# Patient Record
Sex: Male | Born: 1995 | Race: Black or African American | Hispanic: Yes | Marital: Single | Smoking: Current some day smoker
Health system: Southern US, Community
[De-identification: ages and names within clinical notes are randomized; demographics above are authoritative.]

---

## 2018-07-29 ENCOUNTER — Encounter: Payer: Self-pay | Admitting: Internal Medicine

## 2018-07-29 ENCOUNTER — Ambulatory Visit: Payer: Self-pay | Admitting: Internal Medicine

## 2018-07-29 VITALS — BP 122/80 | HR 78 | Temp 100.8°F | Resp 12 | Ht 70.25 in | Wt 177.0 lb

## 2018-07-29 DIAGNOSIS — B349 Viral infection, unspecified: Secondary | ICD-10-CM

## 2018-07-29 DIAGNOSIS — J029 Acute pharyngitis, unspecified: Secondary | ICD-10-CM

## 2018-07-29 LAB — POCT RAPID STREP A (OFFICE): Rapid Strep A Screen: NEGATIVE

## 2018-07-29 NOTE — Progress Notes (Signed)
Subjective:    Patient ID: Gregg Chandler, male    DOB: 03/23/1996, 23 y.o.   MRN: [id number]  HPI   Here to establish.  Became ill 2 days ago with fever and headache in temporal parietal area.  Developed body aches.  + posterior pharyngeal drainage.  If tries to cough up phlegm, gags him and vomits.  If brushes teeth, gags and vomits. Vomited up green liquid--very small amount. Able to eat soup down last night. Today, tried to eat some meat and rice, but the smell got to him and he vomited again.  States he later ate the food and has kept it down. He is trying to drink water and juice.  Had hot tea last night with lime and honey. No ear or throat pain Does not work outside.  Has not been outside much recently. Was in [place] for vacation and visiting family for a month, returning to [place] on the 12th of January.    Has taken Nyquil and a medication called algoh, which contains an antipyretic, pain reliever, antihistamine and decongestant.  States the latter really helps.   Current Meds  Medication Sig  . Pseudoeph-Doxylamine-DM-APAP (NYQUIL PO) Take by mouth as needed.   No Known Allergies   No past medical history on file.  History reviewed. No pertinent surgical history.   Family History  Problem Relation Age of Onset  . Diabetes Mother    Social History   Socioeconomic History  . Marital status: Single    Spouse name: Not on file  . Number of children: Not on file  . Years of education: Not on file  . Highest education level: High school graduate  Occupational History  . Occupation: Call center  Social Needs  . Financial resource strain: Not on file  . Food insecurity:    Worry: Sometimes true    Inability: Sometimes true  . Transportation needs:    Medical: No    Non-medical: No  Tobacco Use  . Smoking status: Current Some Day Smoker  . Smokeless tobacco: Never Used  . Tobacco comment: hookah 3 times weekly in DR.  Substance and Sexual  Activity  . Alcohol use: Never    Frequency: Never  . Drug use: Never  . Sexual activity: Not on file  Lifestyle  . Physical activity:    Days per week: Not on file    Minutes per session: Not on file  . Stress: Not on file  Relationships  . Social connections:    Talks on phone: Not on file    Gets together: Not on file    Attends religious service: Not on file    Active member of club or organization: Not on file    Attends meetings of clubs or organizations: Not on file    Relationship status: Not on file  . Intimate partner violence:    Fear of current or ex partner: Not on file    Emotionally abused: Not on file    Physically abused: Not on file    Forced sexual activity: Not on file  Other Topics Concern  . 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  NT over frontal and maxillary sinuses Neck:  Supple, No adenopathy Chest:  CTA CV:  RRR with normal S1 and S2, No S3, S4 or murmur.  Radial and DP pulses normal and equal Abd:  S, NT, No HSM or mass, + BS Skin: no rash.  Rapid strep -     Assessment & Plan:  Viral Syndrome:  Clear liquids small amounts frequently for next 8-12 hours.  If tolerates, to gradually advance diet.   Avoid greasy, heavy foods for next several days. No dairy Ok to continue cold remedies, but to check antipyretic in remedy and do not take extra for fever.

## 2018-07-29 NOTE — Patient Instructions (Signed)
Clear liquids Call if not able to keep water or other liquids down.

## 2019-03-03 ENCOUNTER — Encounter (HOSPITAL_COMMUNITY): Payer: Self-pay | Admitting: Emergency Medicine

## 2019-03-03 ENCOUNTER — Other Ambulatory Visit: Payer: Self-pay

## 2019-03-03 ENCOUNTER — Inpatient Hospital Stay (HOSPITAL_COMMUNITY)
Admission: EM | Admit: 2019-03-03 | Discharge: 2019-03-08 | DRG: 059 | Disposition: A | Discharge status: Home / Self Care | Payer: PRIVATE HEALTH INSURANCE | Attending: Internal Medicine | Admitting: Internal Medicine

## 2019-03-03 ENCOUNTER — Other Ambulatory Visit: Payer: Self-pay | Admitting: Nurse Practitioner

## 2019-03-03 ENCOUNTER — Emergency Department (HOSPITAL_COMMUNITY): Payer: PRIVATE HEALTH INSURANCE

## 2019-03-03 DIAGNOSIS — H468 Other optic neuritis: Secondary | ICD-10-CM | POA: Diagnosis present

## 2019-03-03 DIAGNOSIS — Z8249 Family history of ischemic heart disease and other diseases of the circulatory system: Secondary | ICD-10-CM

## 2019-03-03 DIAGNOSIS — H5461 Unqualified visual loss, right eye, normal vision left eye: Secondary | ICD-10-CM

## 2019-03-03 DIAGNOSIS — G35D Multiple sclerosis, unspecified: Secondary | ICD-10-CM | POA: Diagnosis present

## 2019-03-03 DIAGNOSIS — Z8349 Family history of other endocrine, nutritional and metabolic diseases: Secondary | ICD-10-CM

## 2019-03-03 DIAGNOSIS — R519 Headache, unspecified: Secondary | ICD-10-CM

## 2019-03-03 DIAGNOSIS — R030 Elevated blood-pressure reading, without diagnosis of hypertension: Secondary | ICD-10-CM | POA: Diagnosis present

## 2019-03-03 DIAGNOSIS — H547 Unspecified visual loss: Secondary | ICD-10-CM | POA: Diagnosis not present

## 2019-03-03 DIAGNOSIS — F17200 Nicotine dependence, unspecified, uncomplicated: Secondary | ICD-10-CM | POA: Diagnosis present

## 2019-03-03 DIAGNOSIS — Z833 Family history of diabetes mellitus: Secondary | ICD-10-CM

## 2019-03-03 DIAGNOSIS — Z20828 Contact with and (suspected) exposure to other viral communicable diseases: Secondary | ICD-10-CM | POA: Diagnosis present

## 2019-03-03 DIAGNOSIS — R7982 Elevated C-reactive protein (CRP): Secondary | ICD-10-CM | POA: Diagnosis present

## 2019-03-03 DIAGNOSIS — H6690 Otitis media, unspecified, unspecified ear: Secondary | ICD-10-CM | POA: Diagnosis present

## 2019-03-03 DIAGNOSIS — G35 Multiple sclerosis: Principal | ICD-10-CM | POA: Diagnosis present

## 2019-03-03 DIAGNOSIS — H469 Unspecified optic neuritis: Secondary | ICD-10-CM | POA: Diagnosis present

## 2019-03-03 DIAGNOSIS — D72829 Elevated white blood cell count, unspecified: Secondary | ICD-10-CM | POA: Diagnosis present

## 2019-03-03 LAB — CBC
HCT: 50.1 % (ref 39.0–52.0)
Hemoglobin: 16.3 g/dL (ref 13.0–17.0)
MCH: 28.3 pg (ref 26.0–34.0)
MCHC: 32.5 g/dL (ref 30.0–36.0)
MCV: 87.1 fL (ref 80.0–100.0)
Platelets: 309 10*3/uL (ref 150–400)
RBC: 5.75 MIL/uL (ref 4.22–5.81)
RDW: 12.3 % (ref 11.5–15.5)
WBC: 12 10*3/uL — ABNORMAL HIGH (ref 4.0–10.5)
nRBC: 0 % (ref 0.0–0.2)

## 2019-03-03 LAB — COMPREHENSIVE METABOLIC PANEL
ALT: 29 U/L (ref 0–44)
AST: 22 U/L (ref 15–41)
Albumin: 4.5 g/dL (ref 3.5–5.0)
Alkaline Phosphatase: 43 U/L (ref 38–126)
Anion gap: 13 (ref 5–15)
BUN: 17 mg/dL (ref 6–20)
CO2: 23 mmol/L (ref 22–32)
Calcium: 9.3 mg/dL (ref 8.9–10.3)
Chloride: 100 mmol/L (ref 98–111)
Creatinine, Ser: 0.91 mg/dL (ref 0.61–1.24)
GFR calc Af Amer: >60 mL/min (ref >60)
GFR calc non Af Amer: >60 mL/min (ref >60)
Glucose, Bld: 110 mg/dL — ABNORMAL HIGH (ref 70–99)
Potassium: 4.1 mmol/L (ref 3.5–5.1)
Sodium: 136 mmol/L (ref 135–145)
Total Bilirubin: 0.6 mg/dL (ref 0.3–1.2)
Total Protein: 7.4 g/dL (ref 6.5–8.1)

## 2019-03-03 LAB — DIFFERENTIAL
Abs Immature Granulocytes: 0.04 10*3/uL (ref 0.00–0.07)
Basophils Absolute: 0 10*3/uL (ref 0.0–0.1)
Basophils Relative: 0 %
Eosinophils Absolute: 0 10*3/uL (ref 0.0–0.5)
Eosinophils Relative: 0 %
Immature Granulocytes: 0 %
Lymphocytes Relative: 12 %
Lymphs Abs: 1.5 10*3/uL (ref 0.7–4.0)
Monocytes Absolute: 0.5 10*3/uL (ref 0.1–1.0)
Monocytes Relative: 4 %
Neutro Abs: 10 10*3/uL — ABNORMAL HIGH (ref 1.7–7.7)
Neutrophils Relative %: 84 %

## 2019-03-03 LAB — I-STAT CHEM 8, ED
BUN: 21 mg/dL — ABNORMAL HIGH (ref 6–20)
Calcium, Ion: 1.15 mmol/L (ref 1.15–1.40)
Chloride: 103 mmol/L (ref 98–111)
Creatinine, Ser: 0.8 mg/dL (ref 0.61–1.24)
Glucose, Bld: 109 mg/dL — ABNORMAL HIGH (ref 70–99)
HCT: 50 % (ref 39.0–52.0)
Hemoglobin: 17 g/dL (ref 13.0–17.0)
Potassium: 4 mmol/L (ref 3.5–5.1)
Sodium: 137 mmol/L (ref 135–145)
TCO2: 25 mmol/L (ref 22–32)

## 2019-03-03 LAB — PROTIME-INR
INR: 1.1 (ref 0.8–1.2)
Prothrombin Time: 13.9 seconds (ref 11.4–15.2)

## 2019-03-03 LAB — APTT: aPTT: 33 seconds (ref 24–36)

## 2019-03-03 MED ORDER — SODIUM CHLORIDE 0.9% FLUSH: 3.0000 mL | Freq: Once | INTRAVENOUS | Status: DC

## 2019-03-03 NOTE — ED Triage Notes (Signed)
PT states 7 days ago he lost his vision in the right eye. Denies Lamonte Sakai, denies any other neuro symptoms. Denies injury to eye.

## 2019-03-04 ENCOUNTER — Encounter (HOSPITAL_COMMUNITY): Payer: Self-pay | Admitting: Internal Medicine

## 2019-03-04 ENCOUNTER — Emergency Department (HOSPITAL_COMMUNITY): Payer: PRIVATE HEALTH INSURANCE

## 2019-03-04 DIAGNOSIS — H468 Other optic neuritis: Secondary | ICD-10-CM | POA: Diagnosis present

## 2019-03-04 DIAGNOSIS — Z833 Family history of diabetes mellitus: Secondary | ICD-10-CM | POA: Diagnosis not present

## 2019-03-04 DIAGNOSIS — H6690 Otitis media, unspecified, unspecified ear: Secondary | ICD-10-CM | POA: Diagnosis present

## 2019-03-04 DIAGNOSIS — R030 Elevated blood-pressure reading, without diagnosis of hypertension: Secondary | ICD-10-CM | POA: Diagnosis present

## 2019-03-04 DIAGNOSIS — H547 Unspecified visual loss: Secondary | ICD-10-CM | POA: Diagnosis present

## 2019-03-04 DIAGNOSIS — G35 Multiple sclerosis: Secondary | ICD-10-CM | POA: Diagnosis present

## 2019-03-04 DIAGNOSIS — H469 Unspecified optic neuritis: Secondary | ICD-10-CM | POA: Diagnosis not present

## 2019-03-04 DIAGNOSIS — R7982 Elevated C-reactive protein (CRP): Secondary | ICD-10-CM | POA: Diagnosis present

## 2019-03-04 DIAGNOSIS — Z8249 Family history of ischemic heart disease and other diseases of the circulatory system: Secondary | ICD-10-CM | POA: Diagnosis not present

## 2019-03-04 DIAGNOSIS — D72829 Elevated white blood cell count, unspecified: Secondary | ICD-10-CM

## 2019-03-04 DIAGNOSIS — Z8349 Family history of other endocrine, nutritional and metabolic diseases: Secondary | ICD-10-CM | POA: Diagnosis not present

## 2019-03-04 DIAGNOSIS — Z20828 Contact with and (suspected) exposure to other viral communicable diseases: Secondary | ICD-10-CM | POA: Diagnosis present

## 2019-03-04 DIAGNOSIS — F17200 Nicotine dependence, unspecified, uncomplicated: Secondary | ICD-10-CM | POA: Diagnosis present

## 2019-03-04 LAB — CBG MONITORING, ED: Glucose-Capillary: 130 mg/dL — ABNORMAL HIGH (ref 70–99)

## 2019-03-04 LAB — SEDIMENTATION RATE: Sed Rate: 1 mm/hr (ref 0–16)

## 2019-03-04 LAB — SARS CORONAVIRUS 2 (TAT 6-24 HRS): SARS Coronavirus 2: NEGATIVE

## 2019-03-04 LAB — C-REACTIVE PROTEIN: CRP: 1.2 mg/dL — ABNORMAL HIGH (ref <1.0)

## 2019-03-04 MED ORDER — ENOXAPARIN SODIUM 40 MG/0.4ML ~~LOC~~ SOLN
40.0000 mg | SUBCUTANEOUS | Status: DC
  Administered 2019-03-04 – 2019-03-07 (×4): 40 mg via SUBCUTANEOUS
  Filled 2019-03-04 (×5): qty 0.4

## 2019-03-04 MED ORDER — ONDANSETRON HCL 4 MG/2ML IJ SOLN: 4.0000 mg | Freq: Four times a day (QID) | INTRAMUSCULAR | Status: DC | PRN

## 2019-03-04 MED ORDER — GADOBUTROL 1 MMOL/ML IV SOLN
7.5000 mL | Freq: Once | INTRAVENOUS | Status: AC | PRN
  Administered 2019-03-04: 7.5 mL via INTRAVENOUS

## 2019-03-04 MED ORDER — DIPHENHYDRAMINE HCL 25 MG PO CAPS
25.0000 mg | ORAL_CAPSULE | Freq: Once | ORAL | Status: AC
  Administered 2019-03-04: 25 mg via ORAL
  Filled 2019-03-04: qty 1

## 2019-03-04 MED ORDER — ACETAMINOPHEN 650 MG RE SUPP: 650.0000 mg | Freq: Four times a day (QID) | RECTAL | Status: DC | PRN

## 2019-03-04 MED ORDER — ONDANSETRON HCL 4 MG PO TABS: 4.0000 mg | ORAL_TABLET | Freq: Four times a day (QID) | ORAL | Status: DC | PRN

## 2019-03-04 MED ORDER — ALBUTEROL SULFATE (2.5 MG/3ML) 0.083% IN NEBU: 2.5000 mg | INHALATION_SOLUTION | Freq: Four times a day (QID) | RESPIRATORY_TRACT | Status: DC | PRN

## 2019-03-04 MED ORDER — SODIUM CHLORIDE 0.9 % IV SOLN
1000.0000 mg | INTRAVENOUS | Status: AC
  Administered 2019-03-05 – 2019-03-08 (×4): 1000 mg via INTRAVENOUS
  Filled 2019-03-04 (×4): qty 8

## 2019-03-04 MED ORDER — AMOXICILLIN-POT CLAVULANATE 875-125 MG PO TABS
1.0000 | ORAL_TABLET | Freq: Two times a day (BID) | ORAL | Status: AC
  Administered 2019-03-04 – 2019-03-06 (×6): 1 via ORAL
  Filled 2019-03-04 (×7): qty 1

## 2019-03-04 MED ORDER — TRAZODONE HCL 50 MG PO TABS
50.0000 mg | ORAL_TABLET | Freq: Once | ORAL | Status: AC
  Administered 2019-03-04: 50 mg via ORAL
  Filled 2019-03-04 (×2): qty 1

## 2019-03-04 MED ORDER — PANTOPRAZOLE SODIUM 40 MG PO TBEC
40.0000 mg | DELAYED_RELEASE_TABLET | Freq: Every day | ORAL | Status: DC
  Administered 2019-03-04 – 2019-03-08 (×5): 40 mg via ORAL
  Filled 2019-03-04 (×5): qty 1

## 2019-03-04 MED ORDER — SODIUM CHLORIDE 0.9 % IV SOLN
1000.0000 mg | Freq: Once | INTRAVENOUS | Status: AC
  Administered 2019-03-04: 1000 mg via INTRAVENOUS
  Filled 2019-03-04: qty 8

## 2019-03-04 MED ORDER — LORAZEPAM 2 MG/ML IJ SOLN
1.0000 mg | Freq: Once | INTRAMUSCULAR | Status: AC
  Administered 2019-03-04: 1 mg via INTRAVENOUS
  Filled 2019-03-04: qty 1

## 2019-03-04 MED ORDER — ACETAMINOPHEN 325 MG PO TABS
650.0000 mg | ORAL_TABLET | Freq: Four times a day (QID) | ORAL | Status: DC | PRN
  Administered 2019-03-05 – 2019-03-06 (×2): 650 mg via ORAL
  Filled 2019-03-04 (×2): qty 2

## 2019-03-04 NOTE — ED Notes (Signed)
Pt wanting to leave, asking if we can "call results" discussed with pt he is waiting for EDP eval/treatment room. Alert/oriented, sitting with family. Pt agreeable to stay at this time.

## 2019-03-04 NOTE — Consult Note (Signed)
Neurology Consultation Reason for Consult: Right eye vision loss Referring Physician: Christy Gentles, D  CC: Right eye vision loss  History is obtained from: Patient  HPI: Gregg Chandler is a 23 y.o. male with no past medical history who has had right eye vision loss that started last Wednesday.  Then progressively worsened, reaching a nadir 2 days later.  It has been persistently impaired since that time.  He saw ophthalmologist today who felt that his exam was normal with the exception of visual loss decreased visual acuity in the right.  The patient describes that he has peripheral vision on the right, but his central vision is extremely impaired.  He denies any previous episodes of numbness, weakness, gait changes or other neurological symptoms.  He did have blurred vision in his right eye for 2 days about 5 or 6 years ago, but it spontaneously improved.   ROS: A 14 point ROS was performed and is negative except as noted in the HPI.   History reviewed. No pertinent past medical history.   Family History  Problem Relation Age of Onset  . Diabetes Mother      Social History:  reports that he has been smoking. He has never used smokeless tobacco. He reports that he does not drink alcohol or use drugs.   Exam: Current vital signs: BP 129/70   Pulse 74   Temp 98.9 F (37.2 C) (Oral)   Resp 18   Ht 6' (1.829 m)   Wt 80.7 kg   SpO2 95%   BMI 24.14 kg/m  Vital signs in last 24 hours: Temp:  [98.9 F (37.2 C)] 98.9 F (37.2 C) (08/20 1754) Pulse Rate:  [69-77] 74 (08/21 0215) Resp:  [14-19] 18 (08/21 0215) BP: (129-150)/(68-91) 129/70 (08/21 0215) SpO2:  [95 %-100 %] 95 % (08/21 0215) Weight:  [80.7 kg] 80.7 kg (08/20 1752)   Physical Exam  Constitutional: Appears well-developed and well-nourished.  Psych: Affect appropriate to situation Eyes: No scleral injection HENT: No OP obstrucion Head: Normocephalic.  Cardiovascular: Normal rate and regular rhythm.   Respiratory: Effort normal, non-labored breathing GI: Soft.  No distension. There is no tenderness.  Skin: WDI  Neuro: Mental Status: Patient is awake, alert, oriented to person, place, month, year, and situation. Patient is able to give a clear and coherent history. No signs of aphasia or neglect Cranial Nerves: II: He is able to count fingers in all 4 quadrants of the right hand, but not in the center.. Pupils are equal, but e has an afferent pupillary defect on the right III,IV, VI: EOMI without ptosis or diploplia.  V: Facial sensation is symmetric to temperature VII: Facial movement is symmetric.  VIII: hearing is intact to voice X: Uvula elevates symmetrically XI: Shoulder shrug is symmetric. XII: tongue is midline without atrophy or fasciculations.  Motor: Tone is normal. Bulk is normal. 5/5 strength was present in all four extremities.  Sensory: Sensation is symmetric to light touch and temperature in the arms and legs. Cerebellar: FNF and HKS are intact bilaterally      I have reviewed labs in epic and the results pertinent to this consultation are: CMP-unremarkable  I have reviewed the images obtained: CT head- no explanation for symptoms  Impression: 23 year old male with painless right eye vision loss.  Optic neuritis is usually painful, however, I do not think the lack of reported discomfort is exclusionary.  He has no risk factors for ischemic optic neuropathy and his age would make optic neuritis much  more likely.  I do think that MRI would be helpful, however if there is no other diagnosis suggested by MRI, then I would favor 6 steroid treatment.  Recommendations: 1) MRI brain with and without contrast, MRI orbits with and without contrast 2) ESR, CRP 3) if no other diagnosis is suggested by imaging, I would start Solu-Medrol 1 g daily for 5 days.  Roland Rack, MD [facility] [phone]  If 7pm- 7am, please page neurology on call as  listed in [place].

## 2019-03-04 NOTE — ED Notes (Signed)
Patient transported to MRI 

## 2019-03-04 NOTE — ED Notes (Addendum)
ED TO INPATIENT HANDOFF REPORT  ED Nurse Name and Phone #:  S Name/Age/Gender Gregg Chandler 23 y.o. male Room/Bed: 037C/037C  Code Status   Code Status: Not on file  Home/SNF/Other Home Patient oriented to: self, place, time and situation Is this baseline? Yes   Triage Complete: Triage complete  Chief Complaint loss of vision  Triage Note PT states 7 days ago he lost his vision in the right eye. Denies Gaylyn RongHa, denies any other neuro symptoms. Denies injury to eye.    Allergies No Known Allergies  Level of Care/Admitting Diagnosis ED Disposition    ED Disposition Condition Comment   Admit  [facility] [[id number]]  Level of Care: Med-Surg [16]  Covid Evaluation: Asymptomatic Screening Protocol (No Symptoms)  Diagnosis: Multiple sclerosis (HCC) [340.ICD-9-CM]  Admitting Physician: Hillary BowGARDNER, JARED M [[id number]]  Attending Physician: Hillary BowGARDNER, JARED M [[id number]]  Estimated length of stay: past midnight tomorrow  Certification:: I certify this patient will need inpatient services for at least 2 midnights  PT Class (Do Not Modify): Inpatient [101]  PT Acc Code (Do Not Modify): Private [1]       B Medical/Surgery History History reviewed. No pertinent past medical history. History reviewed. No pertinent surgical history.   A IV Location/Drains/Wounds Patient Lines/Drains/Airways Status   Active Line/Drains/Airways    Name:   Placement date:   Placement time:   Site:   Days:   Peripheral IV 03/04/19 Left Antecubital   03/04/19    0314    Antecubital   less than 1          Intake/Output Last 24 hours  Intake/Output Summary (Last 24 hours) at 03/04/2019 0759 Last data filed at 03/04/2019 0717 Gross per 24 hour  Intake 50 ml  Output -  Net 50 ml    Labs/Imaging Results for orders placed or performed during the hospital encounter of 03/03/19 (from the past 48 hour(s))  I-stat chem 8, ED     Status: Abnormal   Collection Time: 03/03/19  6:21  PM  Result Value Ref Range   Sodium 137 135 - 145 mmol/L   Potassium 4.0 3.5 - 5.1 mmol/L   Chloride 103 98 - 111 mmol/L   BUN 21 (H) 6 - 20 mg/dL   Creatinine, Ser 5.400.80 0.61 - 1.24 mg/dL   Glucose, Bld 981109 (H) 70 - 99 mg/dL   Calcium, Ion 1.911.15 4.781.15 - 1.40 mmol/L   TCO2 25 22 - 32 mmol/L   Hemoglobin 17.0 13.0 - 17.0 g/dL   HCT 29.550.0 62.139.0 - 30.852.0 %  Protime-INR     Status: None   Collection Time: 03/03/19  6:38 PM  Result Value Ref Range   Prothrombin Time 13.9 11.4 - 15.2 seconds   INR 1.1 0.8 - 1.2    Comment: (NOTE) INR goal varies based on device and disease states. Performed at [facility], [address] [place], [place] [address]   APTT     Status: None   Collection Time: 03/03/19  6:38 PM  Result Value Ref Range   aPTT 33 24 - 36 seconds    Comment: Performed at [facility], [address] [place], [place] [address]  CBC     Status: Abnormal   Collection Time: 03/03/19  6:38 PM  Result Value Ref Range   WBC 12.0 (H) 4.0 - 10.5 K/uL   RBC 5.75 4.22 - 5.81 MIL/uL   Hemoglobin 16.3 13.0 - 17.0 g/dL  HCT 50.1 39.0 - 52.0 %   MCV 87.1 80.0 - 100.0 fL   MCH 28.3 26.0 - 34.0 pg   MCHC 32.5 30.0 - 36.0 g/dL   RDW 12.3 11.5 - 15.5 %   Platelets 309 150 - 400 K/uL   nRBC 0.0 0.0 - 0.2 %    Comment: Performed at [facility], [place] [address], [place], [place] [address]  Differential     Status: Abnormal   Collection Time: 03/03/19  6:38 PM  Result Value Ref Range   Neutrophils Relative % 84 %   Neutro Abs 10.0 (H) 1.7 - 7.7 K/uL   Lymphocytes Relative 12 %   Lymphs Abs 1.5 0.7 - 4.0 K/uL   Monocytes Relative 4 %   Monocytes Absolute 0.5 0.1 - 1.0 K/uL   Eosinophils Relative 0 %   Eosinophils Absolute 0.0 0.0 - 0.5 K/uL   Basophils Relative 0 %   Basophils Absolute 0.0 0.0 - 0.1 K/uL   Immature Granulocytes 0 %   Abs Immature Granulocytes 0.04 0.00 - 0.07 K/uL    Comment: Performed at [facility] [address] [place], [place]  [address]  Comprehensive metabolic panel     Status: Abnormal   Collection Time: 03/03/19  6:38 PM  Result Value Ref Range   Sodium 136 135 - 145 mmol/L   Potassium 4.1 3.5 - 5.1 mmol/L   Chloride 100 98 - 111 mmol/L   CO2 23 22 - 32 mmol/L   Glucose, Bld 110 (H) 70 - 99 mg/dL   BUN 17 6 - 20 mg/dL   Creatinine, Ser 0.91 0.61 - 1.24 mg/dL   Calcium 9.3 8.9 - 10.3 mg/dL   Total Protein 7.4 6.5 - 8.1 g/dL   Albumin 4.5 3.5 - 5.0 g/dL   AST 22 15 - 41 U/L   ALT 29 0 - 44 U/L   Alkaline Phosphatase 43 38 - 126 U/L   Total Bilirubin 0.6 0.3 - 1.2 mg/dL   GFR calc non Af Amer >60 >60 mL/min   GFR calc Af Amer >60 >60 mL/min   Anion gap 13 5 - 15    Comment: Performed at [facility], [place] [address], [place], [place] [address]  Sedimentation rate     Status: None   Collection Time: 03/04/19  2:29 AM  Result Value Ref Range   Sed Rate 1 0 - 16 mm/hr    Comment: Performed at [place] [address], [place], [place] [address]  C-reactive protein     Status: Abnormal   Collection Time: 03/04/19  2:29 AM  Result Value Ref Range   CRP 1.2 (H) <1.0 mg/dL    Comment: Performed at [facility] [address], [place], [place] [address]  CBG monitoring, ED     Status: Abnormal   Collection Time: 03/04/19  3:54 AM  Result Value Ref Range   Glucose-Capillary 130 (H) 70 - 99 mg/dL   Ct Head Wo Contrast  Result Date: 03/03/2019 CLINICAL DATA:  Vision abnormality in right eye. EXAM: CT HEAD WITHOUT CONTRAST TECHNIQUE: Contiguous axial images were obtained from the base of the skull through the vertex without intravenous contrast. COMPARISON:  None. FINDINGS: Brain: No evidence of acute infarction, hemorrhage, hydrocephalus, extra-axial collection or mass lesion/mass effect. There is some apparent decreased attenuation in the right occipital lobe which is favored to represent artifact. Vascular: No hyperdense vessel or unexpected calcification. Skull: Normal. Negative  for  fracture or focal lesion. Sinuses/Orbits: No acute finding. Other: None. IMPRESSION: No acute intracranial abnormality. Electronically Signed   By: Katherine Mantlehristopher  Green M.D.   On: 03/03/2019 18:58   Mr Laqueta JeanBrain W And Wo Contrast  Result Date: 03/04/2019 CLINICAL DATA:  Right eye vision loss beginning 9 days ago. Patient describes central vision loss. Referred to the emergency department by Ophthalmology. EXAM: MRI HEAD AND ORBITS WITHOUT AND WITH CONTRAST TECHNIQUE: Multiplanar, multiecho pulse sequences of the brain and surrounding structures were obtained without and with intravenous contrast. Multiplanar, multiecho pulse sequences of the orbits and surrounding structures were obtained including fat saturation techniques, before and after intravenous contrast administration. CONTRAST:  7.5 mL Gadavist COMPARISON:  CT head without contrast 03/03/2019 FINDINGS: MRI HEAD FINDINGS Brain: Multiple radially oriented T2 hyperintensities are noted adjacent to the corpus callosum. Most prominent lesions are in the left parietal lobe. There is abnormal T2 signal along the right side of the anterior genu of the corpus callosum. No restricted diffusion is associated with any of these lesions. There is enhancement of the lesion in the anterior genu of the right corpus callosum. A smaller lesion in the left side of the anterior genu does not enhance. There is also enhancement of the lesion adjacent to the right temporal tip diffusion-weighted images do demonstrate areas of T2 shine through. No acute hemorrhage or other mass lesion is present. The ventricles are of normal size. No significant extraaxial fluid collection is present. The internal auditory canals are within normal limits. The brainstem and cerebellum are within normal limits. Vascular: Flow is present in the major intracranial arteries. Skull and upper cervical spine: The craniocervical junction is normal. Upper cervical spine is within normal limits. Marrow signal  is unremarkable. MRI ORBITS FINDINGS Orbits: Globes are normal bilaterally. The lens is located. Lacrimal gland is unremarkable. The extraocular muscles are normal. Optic nerve is within normal limits bilaterally. There is no significant T2 hyperintensity or enhancement within the nerve. The optic chiasm is normal. The lesion lateral to the right lateral ventricle may impact the optic tracts. Visualized sinuses: The paranasal sinuses and mastoid air cells are clear. Soft tissues: Periorbital soft tissues are within normal limits. IMPRESSION: 1. Numerous T2 hyperintense lesions adjacent to the corpus callosum with some involving the corpus callosum is most suggestive of a demyelinating process such as multiple sclerosis. 2. Enhancing T2 hyperintense lesion adjacent to the temporal horn of the right lateral ventricle measures up to 6 mm. This is concerning for acute demyelination and may impact the optic tracts, consistent with the patient's symptoms. 3. Additional focal area of T2 hyperintensity with enhancement within the anterior genu of the corpus callosum on the right measures up to 7 mm. 4. Multiple other lesions do not enhance. There is no restricted diffusion. 5. No focal lesion within the optic nerves or chiasm. 6. Globes and orbits are otherwise normal. Electronically Signed   By: Marin Robertshristopher  Mattern M.D.   On: 03/04/2019 05:34   Mr Rockwell GermanyOrbits W ZOWo Contrast  Result Date: 03/04/2019 CLINICAL DATA:  Right eye vision loss beginning 9 days ago. Patient describes central vision loss. Referred to the emergency department by Ophthalmology. EXAM: MRI HEAD AND ORBITS WITHOUT AND WITH CONTRAST TECHNIQUE: Multiplanar, multiecho pulse sequences of the brain and surrounding structures were obtained without and with intravenous contrast. Multiplanar, multiecho pulse sequences of the orbits and surrounding structures were obtained including fat saturation techniques, before and after intravenous contrast administration.  CONTRAST:  7.5 mL Gadavist COMPARISON:  CT head without contrast 03/03/2019 FINDINGS: MRI HEAD FINDINGS Brain: Multiple radially oriented T2 hyperintensities are noted adjacent to the corpus callosum. Most prominent lesions are in the left parietal lobe. There is abnormal T2 signal along the right side of the anterior genu of the corpus callosum. No restricted diffusion is associated with any of these lesions. There is enhancement of the lesion in the anterior genu of the right corpus callosum. A smaller lesion in the left side of the anterior genu does not enhance. There is also enhancement of the lesion adjacent to the right temporal tip diffusion-weighted images do demonstrate areas of T2 shine through. No acute hemorrhage or other mass lesion is present. The ventricles are of normal size. No significant extraaxial fluid collection is present. The internal auditory canals are within normal limits. The brainstem and cerebellum are within normal limits. Vascular: Flow is present in the major intracranial arteries. Skull and upper cervical spine: The craniocervical junction is normal. Upper cervical spine is within normal limits. Marrow signal is unremarkable. MRI ORBITS FINDINGS Orbits: Globes are normal bilaterally. The lens is located. Lacrimal gland is unremarkable. The extraocular muscles are normal. Optic nerve is within normal limits bilaterally. There is no significant T2 hyperintensity or enhancement within the nerve. The optic chiasm is normal. The lesion lateral to the right lateral ventricle may impact the optic tracts. Visualized sinuses: The paranasal sinuses and mastoid air cells are clear. Soft tissues: Periorbital soft tissues are within normal limits. IMPRESSION: 1. Numerous T2 hyperintense lesions adjacent to the corpus callosum with some involving the corpus callosum is most suggestive of a demyelinating process such as multiple sclerosis. 2. Enhancing T2 hyperintense lesion adjacent to the  temporal horn of the right lateral ventricle measures up to 6 mm. This is concerning for acute demyelination and may impact the optic tracts, consistent with the patient's symptoms. 3. Additional focal area of T2 hyperintensity with enhancement within the anterior genu of the corpus callosum on the right measures up to 7 mm. 4. Multiple other lesions do not enhance. There is no restricted diffusion. 5. No focal lesion within the optic nerves or chiasm. 6. Globes and orbits are otherwise normal. Electronically Signed   By: Marin Roberts M.D.   On: 03/04/2019 05:34    Pending Labs Unresulted Labs (From admission, onward)    Start     Ordered   03/04/19 0644  SARS CORONAVIRUS 2 Nasal Swab Aptima Multi Swab  (Asymptomatic/Tier 2)  Once,   STAT    Question Answer Comment  Is this test for diagnosis or screening Screening   Symptomatic for COVID-19 as defined by CDC No   Hospitalized for COVID-19 No   Admitted to ICU for COVID-19 No   Previously tested for COVID-19 No   Resident in a congregate (group) care setting No   Employed in healthcare setting No      03/04/19 0643          Vitals/Pain Today's Vitals   03/04/19 0615 03/04/19 0700 03/04/19 0715 03/04/19 0723  BP: 136/83 120/71 120/73 112/60  Pulse: 72 72 71 67  Resp: 20   18  Temp:      TempSrc:      SpO2: 99% 99% 99% 99%  Weight:      Height:      PainSc:        Isolation Precautions No active isolations  Medications Medications  sodium chloride flush (NS) 0.9 % injection 3 mL (has no administration in time  range)  LORazepam (ATIVAN) injection 1 mg (1 mg Intravenous Given 03/04/19 0350)  gadobutrol (GADAVIST) 1 MMOL/ML injection 7.5 mL (7.5 mLs Intravenous Contrast Given 03/04/19 0503)  methylPREDNISolone sodium succinate (SOLU-MEDROL) 1,000 mg in sodium chloride 0.9 % 50 mL IVPB (0 mg Intravenous Stopped 03/04/19 0717)    Mobility walks Low fall risk   Focused Assessments Neuro Assessment Handoff:  Swallow  screen pass? Yes  Cardiac Rhythm: Normal sinus rhythm       Neuro Assessment: Exceptions to WDL Neuro Checks:      Last Documented NIHSS Modified Score:   Has TPA been given? No If patient is a Neuro Trauma and patient is going to OR before floor call report to 4N Charge nurse: [phone] or [phone]     R Recommendations: See Admitting Provider Note  Report given to:   Additional Notes:  MRI confirms multiple sclerosis.  Patient and mother were updated on plan/findings Per neurology notes, patient should be started on Solu-Medrol 1 g.

## 2019-03-04 NOTE — ED Provider Notes (Signed)
[facility] EMERGENCY DEPARTMENT Provider Note   CSN: [id number] Arrival date & time: 03/03/19  1748     History   Chief Complaint Chief Complaint - vision loss  HPI Gregg Chandler is a 23 y.o. male.     The history is provided by the patient.  Eye Problem Location:  Right eye Onset quality:  Sudden Duration:  7 days Timing:  Constant Progression:  Unchanged Chronicity:  New Context: not direct trauma and not scratch   Relieved by:  None tried Worsened by:  Nothing Associated symptoms: blurred vision and decreased vision   Associated symptoms: no double vision, no headaches, no vomiting and no weakness    PT reports that approximately 7 days ago he woke up with visual loss in the right eye.  He denies any eye pain.  He has had mild pain in his eyebrow but none at this time.  No fevers or vomiting.  No eye trauma.  No previous history of eye surgery.  He does not wear corrective lenses. He was seen by his PCP, referred to ophthalmology   PMH- none Home Medications    Prior to Admission medications   Medication Sig Start Date End Date Taking? Authorizing Provider  Pseudoeph-Doxylamine-DM-APAP (NYQUIL PO) Take by mouth as needed.    [provider]    Family History Family History  Problem Relation Age of Onset   Diabetes Mother     Social History Social History   Tobacco Use   Smoking status: Current Some Day Smoker   Smokeless tobacco: Never Used   Tobacco comment: hookah 3 times weekly in DR.  Substance Use Topics   Alcohol use: Never    Frequency: Never   Drug use: Never     Allergies   Patient has no known allergies.   Review of Systems Review of Systems  Constitutional: Negative for fever.  Eyes: Positive for blurred vision and visual disturbance. Negative for double vision and pain.  Gastrointestinal: Negative for vomiting.  Neurological: Negative for weakness and headaches.  All other systems reviewed  and are negative.    Physical Exam Updated Vital Signs BP 125/65    Pulse 69    Temp 98.9 F (37.2 C) (Oral)    Resp 18    Ht 1.829 m (6')    Wt 80.7 kg    SpO2 99%    BMI 24.14 kg/m   Physical Exam  CONSTITUTIONAL: Well developed/well nourished HEAD: Normocephalic/atraumatic EYES: EOMI, pupils equal, no nystagmus,no ptosis No conjunctival erythema.  No signs of trauma.  No proptosis, ?afferent pupil defect on right Patient reports blurred vision in right eye.  No visual disturbance of the left eye.  He has no vision centrally, but does have some vision peripherally ENMT: Mucous membranes moist NECK: supple no meningeal signs, no bruits CV: S1/S2 noted, no murmurs/rubs/gallops noted LUNGS: Lungs are clear to auscultation bilaterally, no apparent distress ABDOMEN: soft, nontender, no rebound or guarding GU:no cva tenderness NEURO:Awake/alert, face symmetric, no arm or leg drift is noted Equal 5/5 strength with shoulder abduction, elbow flex/extension, wrist flex/extension in upper extremities and equal hand grips bilaterally Equal 5/5 strength with hip flexion,knee flex/extension, foot dorsi/plantar flexion Sensation to light touch intact in all extremities EXTREMITIES: pulses normal, full ROM SKIN: warm, color normal PSYCH: no abnormalities of mood noted   ED Treatments / Results  Labs (all labs ordered are listed, but only abnormal results are displayed) Labs Reviewed  CBC - Abnormal; Notable for the  following components:      Result Value   WBC 12.0 (*)    All other components within normal limits  DIFFERENTIAL - Abnormal; Notable for the following components:   Neutro Abs 10.0 (*)    All other components within normal limits  COMPREHENSIVE METABOLIC PANEL - Abnormal; Notable for the following components:   Glucose, Bld 110 (*)    All other components within normal limits  I-STAT CHEM 8, ED - Abnormal; Notable for the following components:   BUN 21 (*)    Glucose,  Bld 109 (*)    All other components within normal limits  CBG MONITORING, ED - Abnormal; Notable for the following components:   Glucose-Capillary 130 (*)    All other components within normal limits  PROTIME-INR  APTT  SEDIMENTATION RATE  C-REACTIVE PROTEIN    EKG EKG Interpretation  Date/Time:  Thursday March 03 2019 17:59:57 EDT Ventricular Rate:  78 PR Interval:  108 QRS Duration: 96 QT Interval:  350 QTC Calculation: 399 R Axis:   90 Text Interpretation:  Sinus rhythm with short PR with occasional Premature ventricular complexes Rightward axis No previous ECGs available Confirmed by Ripley Fraise [phone]) on 03/04/2019 1:44:39 AM   Radiology Ct Head Wo Contrast  Result Date: 03/03/2019 CLINICAL DATA:  Vision abnormality in right eye. EXAM: CT HEAD WITHOUT CONTRAST TECHNIQUE: Contiguous axial images were obtained from the base of the skull through the vertex without intravenous contrast. COMPARISON:  None. FINDINGS: Brain: No evidence of acute infarction, hemorrhage, hydrocephalus, extra-axial collection or mass lesion/mass effect. There is some apparent decreased attenuation in the right occipital lobe which is favored to represent artifact. Vascular: No hyperdense vessel or unexpected calcification. Skull: Normal. Negative for fracture or focal lesion. Sinuses/Orbits: No acute finding. Other: None. IMPRESSION: No acute intracranial abnormality. Electronically Signed   By: Constance Holster M.D.   On: 03/03/2019 18:58   Mr Jeri Cos And Wo Contrast  Result Date: 03/04/2019 CLINICAL DATA:  Right eye vision loss beginning 9 days ago. Patient describes central vision loss. Referred to the emergency department by Ophthalmology. EXAM: MRI HEAD AND ORBITS WITHOUT AND WITH CONTRAST TECHNIQUE: Multiplanar, multiecho pulse sequences of the brain and surrounding structures were obtained without and with intravenous contrast. Multiplanar, multiecho pulse sequences of the orbits and  surrounding structures were obtained including fat saturation techniques, before and after intravenous contrast administration. CONTRAST:  7.5 mL Gadavist COMPARISON:  CT head without contrast 03/03/2019 FINDINGS: MRI HEAD FINDINGS Brain: Multiple radially oriented T2 hyperintensities are noted adjacent to the corpus callosum. Most prominent lesions are in the left parietal lobe. There is abnormal T2 signal along the right side of the anterior genu of the corpus callosum. No restricted diffusion is associated with any of these lesions. There is enhancement of the lesion in the anterior genu of the right corpus callosum. A smaller lesion in the left side of the anterior genu does not enhance. There is also enhancement of the lesion adjacent to the right temporal tip diffusion-weighted images do demonstrate areas of T2 shine through. No acute hemorrhage or other mass lesion is present. The ventricles are of normal size. No significant extraaxial fluid collection is present. The internal auditory canals are within normal limits. The brainstem and cerebellum are within normal limits. Vascular: Flow is present in the major intracranial arteries. Skull and upper cervical spine: The craniocervical junction is normal. Upper cervical spine is within normal limits. Marrow signal is unremarkable. MRI ORBITS FINDINGS Orbits: Globes are  normal bilaterally. The lens is located. Lacrimal gland is unremarkable. The extraocular muscles are normal. Optic nerve is within normal limits bilaterally. There is no significant T2 hyperintensity or enhancement within the nerve. The optic chiasm is normal. The lesion lateral to the right lateral ventricle may impact the optic tracts. Visualized sinuses: The paranasal sinuses and mastoid air cells are clear. Soft tissues: Periorbital soft tissues are within normal limits. IMPRESSION: 1. Numerous T2 hyperintense lesions adjacent to the corpus callosum with some involving the corpus callosum is  most suggestive of a demyelinating process such as multiple sclerosis. 2. Enhancing T2 hyperintense lesion adjacent to the temporal horn of the right lateral ventricle measures up to 6 mm. This is concerning for acute demyelination and may impact the optic tracts, consistent with the patient's symptoms. 3. Additional focal area of T2 hyperintensity with enhancement within the anterior genu of the corpus callosum on the right measures up to 7 mm. 4. Multiple other lesions do not enhance. There is no restricted diffusion. 5. No focal lesion within the optic nerves or chiasm. 6. Globes and orbits are otherwise normal. Electronically Signed   By: Marin Robertshristopher  Mattern M.D.   On: 03/04/2019 05:34   Mr Rockwell GermanyOrbits W RUWo Contrast  Result Date: 03/04/2019 CLINICAL DATA:  Right eye vision loss beginning 9 days ago. Patient describes central vision loss. Referred to the emergency department by Ophthalmology. EXAM: MRI HEAD AND ORBITS WITHOUT AND WITH CONTRAST TECHNIQUE: Multiplanar, multiecho pulse sequences of the brain and surrounding structures were obtained without and with intravenous contrast. Multiplanar, multiecho pulse sequences of the orbits and surrounding structures were obtained including fat saturation techniques, before and after intravenous contrast administration. CONTRAST:  7.5 mL Gadavist COMPARISON:  CT head without contrast 03/03/2019 FINDINGS: MRI HEAD FINDINGS Brain: Multiple radially oriented T2 hyperintensities are noted adjacent to the corpus callosum. Most prominent lesions are in the left parietal lobe. There is abnormal T2 signal along the right side of the anterior genu of the corpus callosum. No restricted diffusion is associated with any of these lesions. There is enhancement of the lesion in the anterior genu of the right corpus callosum. A smaller lesion in the left side of the anterior genu does not enhance. There is also enhancement of the lesion adjacent to the right temporal tip  diffusion-weighted images do demonstrate areas of T2 shine through. No acute hemorrhage or other mass lesion is present. The ventricles are of normal size. No significant extraaxial fluid collection is present. The internal auditory canals are within normal limits. The brainstem and cerebellum are within normal limits. Vascular: Flow is present in the major intracranial arteries. Skull and upper cervical spine: The craniocervical junction is normal. Upper cervical spine is within normal limits. Marrow signal is unremarkable. MRI ORBITS FINDINGS Orbits: Globes are normal bilaterally. The lens is located. Lacrimal gland is unremarkable. The extraocular muscles are normal. Optic nerve is within normal limits bilaterally. There is no significant T2 hyperintensity or enhancement within the nerve. The optic chiasm is normal. The lesion lateral to the right lateral ventricle may impact the optic tracts. Visualized sinuses: The paranasal sinuses and mastoid air cells are clear. Soft tissues: Periorbital soft tissues are within normal limits. IMPRESSION: 1. Numerous T2 hyperintense lesions adjacent to the corpus callosum with some involving the corpus callosum is most suggestive of a demyelinating process such as multiple sclerosis. 2. Enhancing T2 hyperintense lesion adjacent to the temporal horn of the right lateral ventricle measures up to 6 mm. This is  concerning for acute demyelination and may impact the optic tracts, consistent with the patient's symptoms. 3. Additional focal area of T2 hyperintensity with enhancement within the anterior genu of the corpus callosum on the right measures up to 7 mm. 4. Multiple other lesions do not enhance. There is no restricted diffusion. 5. No focal lesion within the optic nerves or chiasm. 6. Globes and orbits are otherwise normal. Electronically Signed   By: Marin Robertshristopher  Mattern M.D.   On: 03/04/2019 05:34    Procedures Procedures    Medications Ordered in ED Medications    sodium chloride flush (NS) 0.9 % injection 3 mL (has no administration in time range)  methylPREDNISolone sodium succinate (SOLU-MEDROL) 1,000 mg in sodium chloride 0.9 % 50 mL IVPB (1,000 mg Intravenous New Bag/Given 03/04/19 0611)  LORazepam (ATIVAN) injection 1 mg (1 mg Intravenous Given 03/04/19 0350)  gadobutrol (GADAVIST) 1 MMOL/ML injection 7.5 mL (7.5 mLs Intravenous Contrast Given 03/04/19 0503)     Initial Impression / Assessment and Plan / ED Course  I have reviewed the triage vital signs and the nursing notes.  Pertinent labs  results that were available during my care of the patient were reviewed by me and considered in my medical decision making (see chart for details).        4:21 AM Patient was sent several hours ago for evaluation.  He was seen by ophthalmology dr Dione Boozegroat  who was concerned that he may have retrobulbar optic neuritis of the right eye.  He recommended extensive labs and MRI brain and orbits with and  without contrast. Discussed the case earlier with Dr. Carmelina Paddockkikrpatrick with neurology Recommend MRI brain/orbits and reassess  6:40 AM MRI confirms multiple sclerosis.  Patient and mother were updated on plan/findings Per neurology notes, patient should be started on Solu-Medrol 1 g. Discussed with Dr. Julian ReilGardner for admission. Final Clinical Impressions(s) / ED Diagnoses   Final diagnoses:  Multiple sclerosis (HCC)  Optic neuritis due to multiple sclerosis [facility](HCC)    ED Discharge Orders    None       Zadie RhineWickline, Jayleah Garbers, MD 03/04/19 [phone]

## 2019-03-04 NOTE — Progress Notes (Signed)
Patient informed RN that he is leaving AMA if his mother (at the bedside)  is unable to spend the night with him.   RN notified the doctor on call (Dr. Kennon Holter). RN explained the patient's statement to the doctor.   Doctor agreed that the patient can sign AMA papers.  RN prepared AMA papers for the patient and explained to the patient that the hospital is not liable for any adverse effects related to discharging AMA.   During the explanation the patient had an individual on FaceTime listening to the conversation.   Patient then explained to his mother what I discussed with him (patient's mother does not speak [place]).   It then appeared that the patient's mother encouraged him to stay.   Patient explained to RN that his mother was afraid to leave him alone at the hospital.   RN offered to call the mother at 0600 with an update of the patient's status during this shift.   Patient decided that he would remain admitted.   RN walked patient's mother to Entrance A where she is waiting for a ride.

## 2019-03-04 NOTE — Progress Notes (Signed)
Patient requests additional sleep aid.   [facility] notified.

## 2019-03-04 NOTE — H&P (Addendum)
History and Physical    Zac Schlicher VZD:[id number] DOB: September 06, 1995 DOA: 03/03/2019  Referring MD/NP/PA: Lyda Perone, MD PCP: Maudie Flakes, FNP  Patient coming from: Home  Chief Complaint: Vision loss in right eye  I have personally briefly reviewed patient's old medical records in [facility]   HPI: Gregg Chandler is a 23 y.o. male without significant past medical history; who presents with complaints of 9 days of right eye vision loss.  He reports having a center of right eye visual field being grayed out.  Denies have any recent trauma/injury, family history of multiple sclerosis, focal weakness, fever, chills, palpitations, chest pain, nausea, vomiting, or diarrhea.  Seen by his ophthalmologist yesterday because of his symptoms and was referred to the emergency department for further evaluation. Reports similar symptoms of blurred vision in the right eye for about 2 days 5-6 years ago that resolved on its own.  He has been on Augmentin for a ear infection and has 3 days left of medication to take.  Patient admits to smoking hookah occasionally.  ED Course: Upon admission into the emergency department patient was noted to be afebrile with initial elevated blood pressures, but currently within normal limits.  Labs significant for WBC 12, sed rate 1, and CRP 1.2.  MRI of the brain along with orbit with and without contrast failed numerous T2 hyperintense lesions concerning for multiple sclerosis.  Neurology had been consulted and recommended starting patient on Solu-Medrol 1 g IV daily.  TRH called to admit.  Review of Systems  Constitutional: Negative for chills and fever.  Eyes: Positive for blurred vision.  A complete 10 point review of systems was performed and negative except for as noted above or in the HPI.  History reviewed. No pertinent past medical history.  History reviewed. No pertinent surgical history.   reports that he has been smoking. He has never used  smokeless tobacco. He reports that he does not drink alcohol or use drugs.  No Known Allergies  Family History  Problem Relation Age of Onset   Diabetes Mother    Hypertension Mother    Hyperlipidemia Mother    Hypertension Father     Prior to Admission medications   Medication Sig Start Date End Date Taking? Authorizing Provider  amoxicillin-clavulanate (AUGMENTIN) 875-125 MG tablet Take 1 tablet by mouth 2 (two) times daily. 02/24/19 03/06/19 Yes [provider]    Physical Exam:  Constitutional: Young male NAD, calm, comfortable Vitals:   03/04/19 0745 03/04/19 0800 03/04/19 0815 03/04/19 0848  BP: (!) 100/51 121/78 119/70 (!) 123/57  Pulse: 69   73  Resp:   18   Temp:    98.1 F (36.7 C)  TempSrc:    Oral  SpO2: 99%  100% 100%  Weight:      Height:       Eyes: PERRL, bilateral conjunctival injection ENMT: Mucous membranes are moist. Posterior pharynx clear of any exudate or lesions. Normal dentition.  Neck: normal, supple, no masses, no thyromegaly Respiratory: clear to auscultation bilaterally, no wheezing, no crackles. Normal respiratory effort. No accessory muscle use.  Cardiovascular: Regular rate and rhythm, no murmurs / rubs / gallops. No extremity edema. 2+ pedal pulses. No carotid bruits.  Abdomen: no tenderness, no masses palpated. No hepatosplenomegaly. Bowel sounds positive.  Musculoskeletal: no clubbing / cyanosis. No joint deformity upper and lower extremities. Good ROM, no contractures. Normal muscle tone.  Skin: no rashes, lesions, ulcers. No induration Neurologic: CN 2-12 grossly intact, except  for right visual deficit. Sensation intact, DTR normal. Strength 5/5 in all 4.  Psychiatric: Normal judgment and insight. Alert and oriented x 3. Normal mood.     Labs on Admission: I have personally reviewed following labs and imaging studies  CBC: Recent Labs  Lab 03/03/19 1821 03/03/19 1838  WBC  --  12.0*  NEUTROABS  --  10.0*  HGB 17.0  16.3  HCT 50.0 50.1  MCV  --  87.1  PLT  --  309   Basic Metabolic Panel: Recent Labs  Lab 03/03/19 1821 03/03/19 1838  NA 137 136  K 4.0 4.1  CL 103 100  CO2  --  23  GLUCOSE 109* 110*  BUN 21* 17  CREATININE 0.80 0.91  CALCIUM  --  9.3   GFR: Estimated Creatinine Clearance: 137.4 mL/min (by C-G formula based on SCr of 0.91 mg/dL). Liver Function Tests: Recent Labs  Lab 03/03/19 1838  AST 22  ALT 29  ALKPHOS 43  BILITOT 0.6  PROT 7.4  ALBUMIN 4.5   No results for input(s): LIPASE, AMYLASE in the last 168 hours. No results for input(s): AMMONIA in the last 168 hours. Coagulation Profile: Recent Labs  Lab 03/03/19 1838  INR 1.1   Cardiac Enzymes: No results for input(s): CKTOTAL, CKMB, CKMBINDEX, TROPONINI in the last 168 hours. BNP (last 3 results) No results for input(s): PROBNP in the last 8760 hours. HbA1C: No results for input(s): HGBA1C in the last 72 hours. CBG: Recent Labs  Lab 03/04/19 0354  GLUCAP 130*   Lipid Profile: No results for input(s): CHOL, HDL, LDLCALC, TRIG, CHOLHDL, LDLDIRECT in the last 72 hours. Thyroid Function Tests: No results for input(s): TSH, T4TOTAL, FREET4, T3FREE, THYROIDAB in the last 72 hours. Anemia Panel: No results for input(s): VITAMINB12, FOLATE, FERRITIN, TIBC, IRON, RETICCTPCT in the last 72 hours. Urine analysis: No results found for: COLORURINE, APPEARANCEUR, LABSPEC, PHURINE, GLUCOSEU, HGBUR, BILIRUBINUR, KETONESUR, PROTEINUR, UROBILINOGEN, NITRITE, LEUKOCYTESUR Sepsis Labs: No results found for this or any previous visit (from the past 240 hour(s)).   Radiological Exams on Admission: Ct Head Wo Contrast  Result Date: 03/03/2019 CLINICAL DATA:  Vision abnormality in right eye. EXAM: CT HEAD WITHOUT CONTRAST TECHNIQUE: Contiguous axial images were obtained from the base of the skull through the vertex without intravenous contrast. COMPARISON:  None. FINDINGS: Brain: No evidence of acute infarction,  hemorrhage, hydrocephalus, extra-axial collection or mass lesion/mass effect. There is some apparent decreased attenuation in the right occipital lobe which is favored to represent artifact. Vascular: No hyperdense vessel or unexpected calcification. Skull: Normal. Negative for fracture or focal lesion. Sinuses/Orbits: No acute finding. Other: None. IMPRESSION: No acute intracranial abnormality. Electronically Signed   By: Katherine Mantlehristopher  Green M.D.   On: 03/03/2019 18:58   Mr Laqueta JeanBrain W And Wo Contrast  Result Date: 03/04/2019 CLINICAL DATA:  Right eye vision loss beginning 9 days ago. Patient describes central vision loss. Referred to the emergency department by Ophthalmology. EXAM: MRI HEAD AND ORBITS WITHOUT AND WITH CONTRAST TECHNIQUE: Multiplanar, multiecho pulse sequences of the brain and surrounding structures were obtained without and with intravenous contrast. Multiplanar, multiecho pulse sequences of the orbits and surrounding structures were obtained including fat saturation techniques, before and after intravenous contrast administration. CONTRAST:  7.5 mL Gadavist COMPARISON:  CT head without contrast 03/03/2019 FINDINGS: MRI HEAD FINDINGS Brain: Multiple radially oriented T2 hyperintensities are noted adjacent to the corpus callosum. Most prominent lesions are in the left parietal lobe. There is abnormal T2 signal along  the right side of the anterior genu of the corpus callosum. No restricted diffusion is associated with any of these lesions. There is enhancement of the lesion in the anterior genu of the right corpus callosum. A smaller lesion in the left side of the anterior genu does not enhance. There is also enhancement of the lesion adjacent to the right temporal tip diffusion-weighted images do demonstrate areas of T2 shine through. No acute hemorrhage or other mass lesion is present. The ventricles are of normal size. No significant extraaxial fluid collection is present. The internal auditory  canals are within normal limits. The brainstem and cerebellum are within normal limits. Vascular: Flow is present in the major intracranial arteries. Skull and upper cervical spine: The craniocervical junction is normal. Upper cervical spine is within normal limits. Marrow signal is unremarkable. MRI ORBITS FINDINGS Orbits: Globes are normal bilaterally. The lens is located. Lacrimal gland is unremarkable. The extraocular muscles are normal. Optic nerve is within normal limits bilaterally. There is no significant T2 hyperintensity or enhancement within the nerve. The optic chiasm is normal. The lesion lateral to the right lateral ventricle may impact the optic tracts. Visualized sinuses: The paranasal sinuses and mastoid air cells are clear. Soft tissues: Periorbital soft tissues are within normal limits. IMPRESSION: 1. Numerous T2 hyperintense lesions adjacent to the corpus callosum with some involving the corpus callosum is most suggestive of a demyelinating process such as multiple sclerosis. 2. Enhancing T2 hyperintense lesion adjacent to the temporal horn of the right lateral ventricle measures up to 6 mm. This is concerning for acute demyelination and may impact the optic tracts, consistent with the patient's symptoms. 3. Additional focal area of T2 hyperintensity with enhancement within the anterior genu of the corpus callosum on the right measures up to 7 mm. 4. Multiple other lesions do not enhance. There is no restricted diffusion. 5. No focal lesion within the optic nerves or chiasm. 6. Globes and orbits are otherwise normal. Electronically Signed   By: Marin Robertshristopher  Mattern M.D.   On: 03/04/2019 05:34   Mr Rockwell GermanyOrbits W ZOWo Contrast  Result Date: 03/04/2019 CLINICAL DATA:  Right eye vision loss beginning 9 days ago. Patient describes central vision loss. Referred to the emergency department by Ophthalmology. EXAM: MRI HEAD AND ORBITS WITHOUT AND WITH CONTRAST TECHNIQUE: Multiplanar, multiecho pulse  sequences of the brain and surrounding structures were obtained without and with intravenous contrast. Multiplanar, multiecho pulse sequences of the orbits and surrounding structures were obtained including fat saturation techniques, before and after intravenous contrast administration. CONTRAST:  7.5 mL Gadavist COMPARISON:  CT head without contrast 03/03/2019 FINDINGS: MRI HEAD FINDINGS Brain: Multiple radially oriented T2 hyperintensities are noted adjacent to the corpus callosum. Most prominent lesions are in the left parietal lobe. There is abnormal T2 signal along the right side of the anterior genu of the corpus callosum. No restricted diffusion is associated with any of these lesions. There is enhancement of the lesion in the anterior genu of the right corpus callosum. A smaller lesion in the left side of the anterior genu does not enhance. There is also enhancement of the lesion adjacent to the right temporal tip diffusion-weighted images do demonstrate areas of T2 shine through. No acute hemorrhage or other mass lesion is present. The ventricles are of normal size. No significant extraaxial fluid collection is present. The internal auditory canals are within normal limits. The brainstem and cerebellum are within normal limits. Vascular: Flow is present in the major intracranial arteries. Skull and  upper cervical spine: The craniocervical junction is normal. Upper cervical spine is within normal limits. Marrow signal is unremarkable. MRI ORBITS FINDINGS Orbits: Globes are normal bilaterally. The lens is located. Lacrimal gland is unremarkable. The extraocular muscles are normal. Optic nerve is within normal limits bilaterally. There is no significant T2 hyperintensity or enhancement within the nerve. The optic chiasm is normal. The lesion lateral to the right lateral ventricle may impact the optic tracts. Visualized sinuses: The paranasal sinuses and mastoid air cells are clear. Soft tissues: Periorbital  soft tissues are within normal limits. IMPRESSION: 1. Numerous T2 hyperintense lesions adjacent to the corpus callosum with some involving the corpus callosum is most suggestive of a demyelinating process such as multiple sclerosis. 2. Enhancing T2 hyperintense lesion adjacent to the temporal horn of the right lateral ventricle measures up to 6 mm. This is concerning for acute demyelination and may impact the optic tracts, consistent with the patient's symptoms. 3. Additional focal area of T2 hyperintensity with enhancement within the anterior genu of the corpus callosum on the right measures up to 7 mm. 4. Multiple other lesions do not enhance. There is no restricted diffusion. 5. No focal lesion within the optic nerves or chiasm. 6. Globes and orbits are otherwise normal. Electronically Signed   By: San Morelle M.D.   On: 03/04/2019 05:34    EKG: Independently reviewed.  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  Patient presents right eye vision loss.  MRI showing multiple T2 lesions concerning for multiple sclerosis. neurology consulted and started patient on high-dose Solu-Medrol. -Admit to a MedSurg bed -Neurochecks -Continue Solu-Medrol 1 g IV daily -Appreciate Dr. Katherine Roan of neurology consultative services, we will follow-up for further recommendations  Otitis media: Patient reports being on Augmentin for 3 days of treatment. -Complete Augmentin regimen  Leukocytosis: Acute.  WBC elevated at 12.  Could be secondary to patient's infection. -Check CBC in a.m.  -Follow-up COVID-19 screening  Elevated CRP: Acute.  CRP mildly elevated at 1.2.  Suspect related to above.  GI prophylaxis: Protonix DVT prophylaxis: Lovenox Code Status: Full Family Communication: No family present at bedside Disposition Plan: Likely discharge home in 4-5 days Consults called: Neurology Admission status:  Inpatient  Norval Morton MD [facility] Pager [phone]   If 7PM-7AM, please contact night-coverage [email or web address] Password [email or web address]  03/04/2019, 10:03 AM

## 2019-03-04 NOTE — Progress Notes (Signed)
Patient requests sleep medication.   Notified [facility] of patient's request.

## 2019-03-05 LAB — CBC
HCT: 45.2 % (ref 39.0–52.0)
Hemoglobin: 14.9 g/dL (ref 13.0–17.0)
MCH: 28.1 pg (ref 26.0–34.0)
MCHC: 33 g/dL (ref 30.0–36.0)
MCV: 85.1 fL (ref 80.0–100.0)
Platelets: 288 10*3/uL (ref 150–400)
RBC: 5.31 MIL/uL (ref 4.22–5.81)
RDW: 12.1 % (ref 11.5–15.5)
WBC: 15.7 10*3/uL — ABNORMAL HIGH (ref 4.0–10.5)
nRBC: 0 % (ref 0.0–0.2)

## 2019-03-05 LAB — BASIC METABOLIC PANEL
Anion gap: 10 (ref 5–15)
BUN: 12 mg/dL (ref 6–20)
CO2: 25 mmol/L (ref 22–32)
Calcium: 8.8 mg/dL — ABNORMAL LOW (ref 8.9–10.3)
Chloride: 102 mmol/L (ref 98–111)
Creatinine, Ser: 0.87 mg/dL (ref 0.61–1.24)
GFR calc Af Amer: >60 mL/min (ref >60)
GFR calc non Af Amer: >60 mL/min (ref >60)
Glucose, Bld: 128 mg/dL — ABNORMAL HIGH (ref 70–99)
Potassium: 3.8 mmol/L (ref 3.5–5.1)
Sodium: 137 mmol/L (ref 135–145)

## 2019-03-05 LAB — HIV ANTIBODY (ROUTINE TESTING W REFLEX): HIV Screen 4th Generation wRfx: NONREACTIVE

## 2019-03-05 MED ORDER — NAPHAZOLINE-GLYCERIN 0.012-0.2 % OP SOLN
1.0000 [drp] | Freq: Four times a day (QID) | OPHTHALMIC | Status: DC | PRN
  Administered 2019-03-05: 1 [drp] via OPHTHALMIC
  Administered 2019-03-06: 2 [drp] via OPHTHALMIC
  Filled 2019-03-05: qty 15

## 2019-03-05 NOTE — Plan of Care (Signed)

## 2019-03-05 NOTE — Progress Notes (Addendum)
NEURO HOSPITALIST PROGRESS NOTE   Subjective: Patient in bed, room is kept very dark. He states that the light does bother him. Patient states his vision is slightly better. He can at leasttell that there is something present when held in central vision of right eye, but he can not state what the item is.   Exam: Vitals:   03/05/19 0454 03/05/19 0741  BP: 104/63 118/65  Pulse: 87 86  Resp: 14 14  Temp: 98.3 F (36.8 C) 98.2 F (36.8 C)  SpO2: 97% 98%    Physical Exam   HEENT-  Normocephalic, no lesions, without obvious abnormality.  Normal external eye and conjunctiva.   Cardiovascular-, pulses palpable throughout   Lungs- Saturations within normal limits on RA Extremities- Warm, dry and intact Musculoskeletal-no joint tenderness, deformity or swelling Skin-warm and dry, no hyperpigmentation, vitiligo, or suspicious lesions   Neuro:  Mental Status: Alert, oriented, thought content appropriate.  Speech fluent without evidence of aphasia.  Able to follow  commands without difficulty. Cranial Nerves: II: peripheral vision is normal. Central vision still impaired in right eye, but better. III,IV, VI: ptosis not present, extra-ocular motions intact bilaterally pupils equal, round, reactive to light afferent pupil on the right.  V,VII: smile symmetric, facial light touch sensation normal bilaterally VIII: hearing normal bilaterally IX,X: uvula rises symmetrically XI: bilateral shoulder shrug XII: midline tongue extension Motor: Right : Upper extremity   5/5    Left:     Upper extremity   5/5  Lower extremity   5/5     Lower extremity   5/5 Tone and bulk:normal tone throughout; no atrophy noted Sensory: light touch intact throughout, bilaterally Cerebellar: normal finger-to-nose, and normal heel-to-shin test Gait: deferred    Medications:  Scheduled: . amoxicillin-clavulanate  1 tablet Oral BID  . enoxaparin (LOVENOX) injection  40 mg Subcutaneous  Q24H  . pantoprazole  40 mg Oral Daily  . sodium chloride flush  3 mL Intravenous Once   Continuous: . methylPREDNISolone (SOLU-MEDROL) injection 1,000 mg (03/05/19 0501)   JYN:[id number]:acetaminophen **OR** acetaminophen, albuterol, ondansetron **OR** ondansetron (ZOFRAN) IV  Pertinent Labs/Diagnostics:   Ct Head Wo Contrast  Result Date: 03/03/2019 CLINICAL DATA:  Vision abnormality in right eye. EXAM: CT HEAD WITHOUT CONTRAST TECHNIQUE: Contiguous axial images were obtained from the base of the skull through the vertex without intravenous contrast. COMPARISON:  None. FINDINGS: Brain: No evidence of acute infarction, hemorrhage, hydrocephalus, extra-axial collection or mass lesion/mass effect. There is some apparent decreased attenuation in the right occipital lobe which is favored to represent artifact. Vascular: No hyperdense vessel or unexpected calcification. Skull: Normal. Negative for fracture or focal lesion. Sinuses/Orbits: No acute finding. Other: None. IMPRESSION: No acute intracranial abnormality. Electronically Signed   By: Katherine Mantlehristopher  Green M.D.   On: 03/03/2019 18:58   Mr Laqueta JeanBrain W And Wo Contrast  Result Date: 03/04/2019 CLINICAL DATA:  Right eye vision loss beginning 9 days ago. Patient describes central vision loss. Referred to the emergency department by Ophthalmology. EXAM: MRI HEAD AND ORBITS WITHOUT AND WITH CONTRAST TECHNIQUE: Multiplanar, multiecho pulse sequences of the brain and surrounding structures were obtained without and with intravenous contrast. Multiplanar, multiecho pulse sequences of the orbits and surrounding structures were obtained including fat saturation techniques, before and after intravenous contrast administration. CONTRAST:  7.5 mL Gadavist COMPARISON:  CT head without contrast 03/03/2019 FINDINGS: MRI HEAD FINDINGS Brain: Multiple  radially oriented T2 hyperintensities are noted adjacent to the corpus callosum. Most prominent lesions are in the left parietal  lobe. There is abnormal T2 signal along the right side of the anterior genu of the corpus callosum. No restricted diffusion is associated with any of these lesions. There is enhancement of the lesion in the anterior genu of the right corpus callosum. A smaller lesion in the left side of the anterior genu does not enhance. There is also enhancement of the lesion adjacent to the right temporal tip diffusion-weighted images do demonstrate areas of T2 shine through. No acute hemorrhage or other mass lesion is present. The ventricles are of normal size. No significant extraaxial fluid collection is present. The internal auditory canals are within normal limits. The brainstem and cerebellum are within normal limits. Vascular: Flow is present in the major intracranial arteries. Skull and upper cervical spine: The craniocervical junction is normal. Upper cervical spine is within normal limits. Marrow signal is unremarkable. MRI ORBITS FINDINGS Orbits: Globes are normal bilaterally. The lens is located. Lacrimal gland is unremarkable. The extraocular muscles are normal. Optic nerve is within normal limits bilaterally. There is no significant T2 hyperintensity or enhancement within the nerve. The optic chiasm is normal. The lesion lateral to the right lateral ventricle may impact the optic tracts. Visualized sinuses: The paranasal sinuses and mastoid air cells are clear. Soft tissues: Periorbital soft tissues are within normal limits. IMPRESSION: 1. Numerous T2 hyperintense lesions adjacent to the corpus callosum with some involving the corpus callosum is most suggestive of a demyelinating process such as multiple sclerosis. 2. Enhancing T2 hyperintense lesion adjacent to the temporal horn of the right lateral ventricle measures up to 6 mm. This is concerning for acute demyelination and may impact the optic tracts, consistent with the patient's symptoms. 3. Additional focal area of T2 hyperintensity with enhancement within  the anterior genu of the corpus callosum on the right measures up to 7 mm. 4. Multiple other lesions do not enhance. There is no restricted diffusion. 5. No focal lesion within the optic nerves or chiasm. 6. Globes and orbits are otherwise normal. Electronically Signed   By: Marin Roberts M.D.   On: 03/04/2019 05:34   Mr Rockwell [place] ZY Contrast  Result Date: 03/04/2019 CLINICAL DATA:  Right eye vision loss beginning 9 days ago. Patient describes central vision loss. Referred to the emergency department by Ophthalmology. EXAM: MRI HEAD AND ORBITS WITHOUT AND WITH CONTRAST TECHNIQUE: Multiplanar, multiecho pulse sequences of the brain and surrounding structures were obtained without and with intravenous contrast. Multiplanar, multiecho pulse sequences of the orbits and surrounding structures were obtained including fat saturation techniques, before and after intravenous contrast administration. CONTRAST:  7.5 mL Gadavist COMPARISON:  CT head without contrast 03/03/2019 FINDINGS: MRI HEAD FINDINGS Brain: Multiple radially oriented T2 hyperintensities are noted adjacent to the corpus callosum. Most prominent lesions are in the left parietal lobe. There is abnormal T2 signal along the right side of the anterior genu of the corpus callosum. No restricted diffusion is associated with any of these lesions. There is enhancement of the lesion in the anterior genu of the right corpus callosum. A smaller lesion in the left side of the anterior genu does not enhance. There is also enhancement of the lesion adjacent to the right temporal tip diffusion-weighted images do demonstrate areas of T2 shine through. No acute hemorrhage or other mass lesion is present. The ventricles are of normal size. No significant extraaxial fluid collection is present. The  internal auditory canals are within normal limits. The brainstem and cerebellum are within normal limits. Vascular: Flow is present in the major intracranial arteries.  Skull and upper cervical spine: The craniocervical junction is normal. Upper cervical spine is within normal limits. Marrow signal is unremarkable. MRI ORBITS FINDINGS Orbits: Globes are normal bilaterally. The lens is located. Lacrimal gland is unremarkable. The extraocular muscles are normal. Optic nerve is within normal limits bilaterally. There is no significant T2 hyperintensity or enhancement within the nerve. The optic chiasm is normal. The lesion lateral to the right lateral ventricle may impact the optic tracts. Visualized sinuses: The paranasal sinuses and mastoid air cells are clear. Soft tissues: Periorbital soft tissues are within normal limits. IMPRESSION: 1. Numerous T2 hyperintense lesions adjacent to the corpus callosum with some involving the corpus callosum is most suggestive of a demyelinating process such as multiple sclerosis. 2. Enhancing T2 hyperintense lesion adjacent to the temporal horn of the right lateral ventricle measures up to 6 mm. This is concerning for acute demyelination and may impact the optic tracts, consistent with the patient's symptoms. 3. Additional focal area of T2 hyperintensity with enhancement within the anterior genu of the corpus callosum on the right measures up to 7 mm. 4. Multiple other lesions do not enhance. There is no restricted diffusion. 5. No focal lesion within the optic nerves or chiasm. 6. Globes and orbits are otherwise normal. Electronically Signed   By: San Morelle M.D.   On: 03/04/2019 05:34    CRP: 1.2 ( slightly elevated) Sed rate: WNL  Assessment:  Patient with new diagnosis of multiple sclerosis.  Presented with right eye visual blurring and loss of color vision.  No optic neuritis on MRI orbits however patient does have multiple T2 changes including enhancing lesion in the right temporal lobe affecting his optic tracts.  Patient does state that he had prior episode of right eye vision blurring that resolved after a few  days.  New diagnosis of multiple sclerosis, likely relapsing remitting.  Recommendations:  --Continue 1 g solu-medrol for total 5 doses ( 2nd dose due today)  -- neurology will continue to follow   Laurey Morale, MSN, NP-C [facility] [phone]  Attending neurologist's note to follow    03/05/2019, 8:24 AM  NEUROHOSPITALIST ADDENDUM Performed a face to face diagnostic evaluation.   I have reviewed the contents of history and physical exam as documented by PA/ARNP/Resident and agree with above documentation.  I have discussed and formulated the above plan as documented. Edits to the note have been made as needed.  New diagnosis of MS.  Likely not optic neuritis with no enhancement seen on MRI orbit of the optic nerve.  Instead I think his optic tracts are involved.  Pattern of T2 signal changes is periventricular as well as in the corpus callosum with some lesions enhancing favoring MS. agree with 5 days of steroids for treating MS exacerbation.    He will need follow-up with MS specialist Dr. Felecia Shelling to start disease modifying treatment.  No need for LP as inpatient history and MRI appear to be consistent with MS.  Interestingly, patient's brother may also have MS.    Karena Addison Sundiata Ferrick MD [facility] [phone]   If 7pm to 7am, please call on call as listed on AMION.

## 2019-03-05 NOTE — Progress Notes (Signed)
PROGRESS NOTE    Gregg Chandler   WJX:[id number]  DOB: 04/10/1996  DOA: 03/03/2019 PCP: Maudie FlakesAnderson, Shane D, FNP   Brief Narrative:  Gregg Chandler  is a 23 y.o. male without significant past medical history; who presents with complaints of 9 days of right eye vision loss.  He reports having a center of right eye visual field being grayed out. Seen by his ophthalmologist because of his symptoms and was referred to the emergency department for further evaluation. Reports similar symptoms of blurred vision in the right eye for about 2 days 5-6 years ago that resolved on its own.  He has been on Augmentin for a ear infection and has 3 days left of medication to take.    Subjective: He states his vision is coming back and blurry. He has no other complaints.     Assessment & Plan:   Principal Problem:   Optic neuritis/ Multiple sclerosis  - noted on MRI - currently on high dose steroids- appreciate management per Neuro  Active Problems:     Leukocytosis   Otitis media - cont Augmentin   Time spent in minutes:  35 DVT prophylaxis: Lovenox Code Status: Full code Family Communication:  Disposition Plan: home when stable Consultants:   Neuro Procedures:    Antimicrobials:  Anti-infectives (From admission, onward)   Start     Dose/Rate Route Frequency Ordered Stop   03/04/19 1000  amoxicillin-clavulanate (AUGMENTIN) 875-125 MG per tablet 1 tablet     1 tablet Oral 2 times daily 03/04/19 0833 03/07/19 0959       Objective: Vitals:   03/04/19 1648 03/04/19 1936 03/05/19 0454 03/05/19 0741  BP: 130/67 140/67 104/63 118/65  Pulse: 92 96 87 86  Resp: 18 14 14 14   Temp: 98.8 F (37.1 C) 98.4 F (36.9 C) 98.3 F (36.8 C) 98.2 F (36.8 C)  TempSrc: Oral Oral Oral Oral  SpO2: 99% 100% 97% 98%  Weight:      Height:        Intake/Output Summary (Last 24 hours) at 03/05/2019 0901 Last data filed at 03/04/2019 2000 Gross per 24 hour  Intake 1190 ml  Output --  Net  1190 ml   Filed Weights   03/03/19 1752  Weight: 80.7 kg    Examination: General exam: Appears comfortable  HEENT: PERRLA, oral mucosa moist, no sclera icterus or thrush Respiratory system: Clear to auscultation. Respiratory effort normal. Cardiovascular system: S1 & S2 heard, RRR.   Gastrointestinal system: Abdomen soft, non-tender, nondistended. Normal bowel sounds. Central nervous system: Alert and oriented. Decreased visual acuity in left eye.  No focal neurological deficits. Extremities: No cyanosis, clubbing or edema Skin: No rashes or ulcers Psychiatry:  Mood & affect appropriate.     Data Reviewed: I have personally reviewed following labs and imaging studies  CBC: Recent Labs  Lab 03/03/19 1821 03/03/19 1838 03/05/19 0456  WBC  --  12.0* 15.7*  NEUTROABS  --  10.0*  --   HGB 17.0 16.3 14.9  HCT 50.0 50.1 45.2  MCV  --  87.1 85.1  PLT  --  309 288   Basic Metabolic Panel: Recent Labs  Lab 03/03/19 1821 03/03/19 1838 03/05/19 0456  NA 137 136 137  K 4.0 4.1 3.8  CL 103 100 102  CO2  --  23 25  GLUCOSE 109* 110* 128*  BUN 21* 17 12  CREATININE 0.80 0.91 0.87  CALCIUM  --  9.3 8.8*   GFR: Estimated Creatinine Clearance: 143.7  mL/min (by C-G formula based on SCr of 0.87 mg/dL). Liver Function Tests: Recent Labs  Lab 03/03/19 1838  AST 22  ALT 29  ALKPHOS 43  BILITOT 0.6  PROT 7.4  ALBUMIN 4.5   No results for input(s): LIPASE, AMYLASE in the last 168 hours. No results for input(s): AMMONIA in the last 168 hours. Coagulation Profile: Recent Labs  Lab 03/03/19 1838  INR 1.1   Cardiac Enzymes: No results for input(s): CKTOTAL, CKMB, CKMBINDEX, TROPONINI in the last 168 hours. BNP (last 3 results) No results for input(s): PROBNP in the last 8760 hours. HbA1C: No results for input(s): HGBA1C in the last 72 hours. CBG: Recent Labs  Lab 03/04/19 0354  GLUCAP 130*   Lipid Profile: No results for input(s): CHOL, HDL, LDLCALC, TRIG,  CHOLHDL, LDLDIRECT in the last 72 hours. Thyroid Function Tests: No results for input(s): TSH, T4TOTAL, FREET4, T3FREE, THYROIDAB in the last 72 hours. Anemia Panel: No results for input(s): VITAMINB12, FOLATE, FERRITIN, TIBC, IRON, RETICCTPCT in the last 72 hours. Urine analysis: No results found for: COLORURINE, APPEARANCEUR, LABSPEC, PHURINE, GLUCOSEU, HGBUR, BILIRUBINUR, KETONESUR, PROTEINUR, UROBILINOGEN, NITRITE, LEUKOCYTESUR Sepsis Labs: @LABRCNTIP (procalcitonin:4,lacticidven:4) ) Recent Results (from the past 240 hour(s))  SARS CORONAVIRUS 2 Nasal Swab Aptima Multi Swab     Status: None   Collection Time: 03/04/19  6:44 AM   Specimen: Aptima Multi Swab; Nasal Swab  Result Value Ref Range Status   SARS Coronavirus 2 NEGATIVE NEGATIVE Final    Comment: (NOTE) SARS-CoV-2 target nucleic acids are NOT DETECTED. The SARS-CoV-2 RNA is generally detectable in upper and lower respiratory specimens during the acute phase of infection. Negative results do not preclude SARS-CoV-2 infection, do not rule out co-infections with other pathogens, and should not be used as the sole basis for treatment or other patient management decisions. Negative results must be combined with clinical observations, patient history, and epidemiological information. The expected result is Negative. Fact Sheet for Patients: HairSlick.nohttps://www.fda.gov/media/138098/download Fact Sheet for Healthcare Providers: quierodirigir.comhttps://www.fda.gov/media/138095/download This test is not yet approved or cleared by the [place] FDA and  has been authorized for detection and/or diagnosis of SARS-CoV-2 by FDA under an Emergency Use Authorization (EUA). This EUA will remain  in effect (meaning this test can be used) for the duration of the COVID-19 declaration under Section 56 4(b)(1) of the Act, 21 U.S.C. section 360bbb-3(b)(1), unless the authorization is terminated or revoked sooner. Performed at [facility], [address] [place], [place] [address]          Radiology Studies: Ct Head Wo Contrast  Result Date: 03/03/2019 CLINICAL DATA:  Vision abnormality in right eye. EXAM: CT HEAD WITHOUT CONTRAST TECHNIQUE: Contiguous axial images were obtained from the base of the skull through the vertex without intravenous contrast. COMPARISON:  None. FINDINGS: Brain: No evidence of acute infarction, hemorrhage, hydrocephalus, extra-axial collection or mass lesion/mass effect. There is some apparent decreased attenuation in the right occipital lobe which is favored to represent artifact. Vascular: No hyperdense vessel or unexpected calcification. Skull: Normal. Negative for fracture or focal lesion. Sinuses/Orbits: No acute finding. Other: None. IMPRESSION: No acute intracranial abnormality. Electronically Signed   By: Katherine Mantlehristopher  Green M.D.   On: 03/03/2019 18:58   Mr Laqueta JeanBrain W And Wo Contrast  Result Date: 03/04/2019 CLINICAL DATA:  Right eye vision loss beginning 9 days ago. Patient describes central vision loss. Referred to the emergency department by Ophthalmology. EXAM: MRI HEAD AND ORBITS WITHOUT AND WITH CONTRAST TECHNIQUE: Multiplanar, multiecho pulse sequences  of the brain and surrounding structures were obtained without and with intravenous contrast. Multiplanar, multiecho pulse sequences of the orbits and surrounding structures were obtained including fat saturation techniques, before and after intravenous contrast administration. CONTRAST:  7.5 mL Gadavist COMPARISON:  CT head without contrast 03/03/2019 FINDINGS: MRI HEAD FINDINGS Brain: Multiple radially oriented T2 hyperintensities are noted adjacent to the corpus callosum. Most prominent lesions are in the left parietal lobe. There is abnormal T2 signal along the right side of the anterior genu of the corpus callosum. No restricted diffusion is associated with any of these lesions. There is enhancement of the lesion in the anterior genu of the right corpus  callosum. A smaller lesion in the left side of the anterior genu does not enhance. There is also enhancement of the lesion adjacent to the right temporal tip diffusion-weighted images do demonstrate areas of T2 shine through. No acute hemorrhage or other mass lesion is present. The ventricles are of normal size. No significant extraaxial fluid collection is present. The internal auditory canals are within normal limits. The brainstem and cerebellum are within normal limits. Vascular: Flow is present in the major intracranial arteries. Skull and upper cervical spine: The craniocervical junction is normal. Upper cervical spine is within normal limits. Marrow signal is unremarkable. MRI ORBITS FINDINGS Orbits: Globes are normal bilaterally. The lens is located. Lacrimal gland is unremarkable. The extraocular muscles are normal. Optic nerve is within normal limits bilaterally. There is no significant T2 hyperintensity or enhancement within the nerve. The optic chiasm is normal. The lesion lateral to the right lateral ventricle may impact the optic tracts. Visualized sinuses: The paranasal sinuses and mastoid air cells are clear. Soft tissues: Periorbital soft tissues are within normal limits. IMPRESSION: 1. Numerous T2 hyperintense lesions adjacent to the corpus callosum with some involving the corpus callosum is most suggestive of a demyelinating process such as multiple sclerosis. 2. Enhancing T2 hyperintense lesion adjacent to the temporal horn of the right lateral ventricle measures up to 6 mm. This is concerning for acute demyelination and may impact the optic tracts, consistent with the patient's symptoms. 3. Additional focal area of T2 hyperintensity with enhancement within the anterior genu of the corpus callosum on the right measures up to 7 mm. 4. Multiple other lesions do not enhance. There is no restricted diffusion. 5. No focal lesion within the optic nerves or chiasm. 6. Globes and orbits are otherwise  normal. Electronically Signed   By: Marin Robertshristopher  Mattern M.D.   On: 03/04/2019 05:34   Mr Rockwell GermanyOrbits W WUWo Contrast  Result Date: 03/04/2019 CLINICAL DATA:  Right eye vision loss beginning 9 days ago. Patient describes central vision loss. Referred to the emergency department by Ophthalmology. EXAM: MRI HEAD AND ORBITS WITHOUT AND WITH CONTRAST TECHNIQUE: Multiplanar, multiecho pulse sequences of the brain and surrounding structures were obtained without and with intravenous contrast. Multiplanar, multiecho pulse sequences of the orbits and surrounding structures were obtained including fat saturation techniques, before and after intravenous contrast administration. CONTRAST:  7.5 mL Gadavist COMPARISON:  CT head without contrast 03/03/2019 FINDINGS: MRI HEAD FINDINGS Brain: Multiple radially oriented T2 hyperintensities are noted adjacent to the corpus callosum. Most prominent lesions are in the left parietal lobe. There is abnormal T2 signal along the right side of the anterior genu of the corpus callosum. No restricted diffusion is associated with any of these lesions. There is enhancement of the lesion in the anterior genu of the right corpus callosum. A smaller lesion in the left  side of the anterior genu does not enhance. There is also enhancement of the lesion adjacent to the right temporal tip diffusion-weighted images do demonstrate areas of T2 shine through. No acute hemorrhage or other mass lesion is present. The ventricles are of normal size. No significant extraaxial fluid collection is present. The internal auditory canals are within normal limits. The brainstem and cerebellum are within normal limits. Vascular: Flow is present in the major intracranial arteries. Skull and upper cervical spine: The craniocervical junction is normal. Upper cervical spine is within normal limits. Marrow signal is unremarkable. MRI ORBITS FINDINGS Orbits: Globes are normal bilaterally. The lens is located. Lacrimal gland  is unremarkable. The extraocular muscles are normal. Optic nerve is within normal limits bilaterally. There is no significant T2 hyperintensity or enhancement within the nerve. The optic chiasm is normal. The lesion lateral to the right lateral ventricle may impact the optic tracts. Visualized sinuses: The paranasal sinuses and mastoid air cells are clear. Soft tissues: Periorbital soft tissues are within normal limits. IMPRESSION: 1. Numerous T2 hyperintense lesions adjacent to the corpus callosum with some involving the corpus callosum is most suggestive of a demyelinating process such as multiple sclerosis. 2. Enhancing T2 hyperintense lesion adjacent to the temporal horn of the right lateral ventricle measures up to 6 mm. This is concerning for acute demyelination and may impact the optic tracts, consistent with the patient's symptoms. 3. Additional focal area of T2 hyperintensity with enhancement within the anterior genu of the corpus callosum on the right measures up to 7 mm. 4. Multiple other lesions do not enhance. There is no restricted diffusion. 5. No focal lesion within the optic nerves or chiasm. 6. Globes and orbits are otherwise normal. Electronically Signed   By: San Morelle M.D.   On: 03/04/2019 05:34      Scheduled Meds:  amoxicillin-clavulanate  1 tablet Oral BID   enoxaparin (LOVENOX) injection  40 mg Subcutaneous Q24H   pantoprazole  40 mg Oral Daily   sodium chloride flush  3 mL Intravenous Once   Continuous Infusions:  methylPREDNISolone (SOLU-MEDROL) injection 1,000 mg (03/05/19 0501)     LOS: 1 day      Debbe Odea, MD [facility] Pager: [email or web address] Password [email or web address] 03/05/2019, 9:01 AM

## 2019-03-06 LAB — GLUCOSE, CAPILLARY: Glucose-Capillary: 113 mg/dL — ABNORMAL HIGH (ref 70–99)

## 2019-03-06 NOTE — Plan of Care (Signed)
  Problem: Education: Goal: Knowledge of General Education information will improve Description: Including pain rating scale, medication(s)/side effects and non-pharmacologic comfort measures Outcome: Progressing   Problem: Coping: Goal: Level of anxiety will decrease Outcome: Progressing   Problem: Elimination: Goal: Will not experience complications related to bowel motility Outcome: Progressing   Problem: Safety: Goal: Ability to remain free from injury will improve Outcome: Progressing   

## 2019-03-06 NOTE — Plan of Care (Signed)
°  Problem: Coping: °Goal: Level of anxiety will decrease °Outcome: Progressing °  °

## 2019-03-06 NOTE — Progress Notes (Signed)
PROGRESS NOTE    Gregg Chandler   [id number]  DOB: 09/13/1995  DOA: 03/03/2019 PCP: Maudie FlakesAnderson, Shane D, FNP   Brief Narrative:  Gregg Chandler  is a 23 y.o. male without significant past medical history; who presents with complaints of 9 days of right eye vision loss.  He reports having a center of right eye visual field being grayed out. Seen by his ophthalmologist because of his symptoms and was referred to the emergency department for further evaluation. Reports similar symptoms of blurred vision in the right eye for about 2 days 5-6 years ago that resolved on its own.  He has been on Augmentin for a ear infection and has 3 days left of medication to take.    Subjective: He states that his vision is slightly better today.     Assessment & Plan:   Principal Problem:   Optic neuritis/ Multiple sclerosis  - noted on MRI - currently on high dose steroids- appreciate management per Neuro  Active Problems:     Leukocytosis   Otitis media - cont Augmentin   Time spent in minutes:  35 DVT prophylaxis: Lovenox Code Status: Full code Family Communication:  Disposition Plan: home when stable Consultants:   Neuro Procedures:    Antimicrobials:  Anti-infectives (From admission, onward)   Start     Dose/Rate Route Frequency Ordered Stop   03/04/19 1000  amoxicillin-clavulanate (AUGMENTIN) 875-125 MG per tablet 1 tablet     1 tablet Oral 2 times daily 03/04/19 0833 03/07/19 0959       Objective: Vitals:   03/05/19 1542 03/05/19 2052 03/06/19 0452 03/06/19 0711  BP: (!) 131/57 130/71 103/60 127/61  Pulse: 77 72 61 63  Resp: 17 18 19 15   Temp: 98.3 F (36.8 C) 98.3 F (36.8 C) 98.2 F (36.8 C) 98.2 F (36.8 C)  TempSrc: Oral Oral Oral Oral  SpO2: 97% 97% 99% 99%  Weight:      Height:        Intake/Output Summary (Last 24 hours) at 03/06/2019 0930 Last data filed at 03/06/2019 0900 Gross per 24 hour  Intake 418 ml  Output -  Net 418 ml   Filed Weights    03/03/19 1752  Weight: 80.7 kg    Examination: General exam: Appears comfortable  HEENT: PERRLA, oral mucosa moist, no sclera icterus or thrush Respiratory system: Clear to auscultation. Respiratory effort normal. Cardiovascular system: S1 & S2 heard,  No murmurs  Gastrointestinal system: Abdomen soft, non-tender, nondistended. Normal bowel sounds   Central nervous system: Alert and oriented. No focal neurological deficits. Extremities: No cyanosis, clubbing or edema Skin: No rashes or ulcers Psychiatry:  Mood & affect appropriate.    Data Reviewed: I have personally reviewed following labs and imaging studies  CBC: Recent Labs  Lab 03/03/19 1821 03/03/19 1838 03/05/19 0456  WBC  --  12.0* 15.7*  NEUTROABS  --  10.0*  --   HGB 17.0 16.3 14.9  HCT 50.0 50.1 45.2  MCV  --  87.1 85.1  PLT  --  309 288   Basic Metabolic Panel: Recent Labs  Lab 03/03/19 1821 03/03/19 1838 03/05/19 0456  NA 137 136 137  K 4.0 4.1 3.8  CL 103 100 102  CO2  --  23 25  GLUCOSE 109* 110* 128*  BUN 21* 17 12  CREATININE 0.80 0.91 0.87  CALCIUM  --  9.3 8.8*   GFR: Estimated Creatinine Clearance: 143.7 mL/min (by C-G formula based on SCr of 0.87  mg/dL). Liver Function Tests: Recent Labs  Lab 03/03/19 1838  AST 22  ALT 29  ALKPHOS 43  BILITOT 0.6  PROT 7.4  ALBUMIN 4.5   No results for input(s): LIPASE, AMYLASE in the last 168 hours. No results for input(s): AMMONIA in the last 168 hours. Coagulation Profile: Recent Labs  Lab 03/03/19 1838  INR 1.1   Cardiac Enzymes: No results for input(s): CKTOTAL, CKMB, CKMBINDEX, TROPONINI in the last 168 hours. BNP (last 3 results) No results for input(s): PROBNP in the last 8760 hours. HbA1C: No results for input(s): HGBA1C in the last 72 hours. CBG: Recent Labs  Lab 03/04/19 0354 03/06/19 0451  GLUCAP 130* 113*   Lipid Profile: No results for input(s): CHOL, HDL, LDLCALC, TRIG, CHOLHDL, LDLDIRECT in the last 72 hours.  Thyroid Function Tests: No results for input(s): TSH, T4TOTAL, FREET4, T3FREE, THYROIDAB in the last 72 hours. Anemia Panel: No results for input(s): VITAMINB12, FOLATE, FERRITIN, TIBC, IRON, RETICCTPCT in the last 72 hours. Urine analysis: No results found for: COLORURINE, APPEARANCEUR, LABSPEC, PHURINE, GLUCOSEU, HGBUR, BILIRUBINUR, KETONESUR, PROTEINUR, UROBILINOGEN, NITRITE, LEUKOCYTESUR Sepsis Labs: @LABRCNTIP (procalcitonin:4,lacticidven:4) ) Recent Results (from the past 240 hour(s))  SARS CORONAVIRUS 2 Nasal Swab Aptima Multi Swab     Status: None   Collection Time: 03/04/19  6:44 AM   Specimen: Aptima Multi Swab; Nasal Swab  Result Value Ref Range Status   SARS Coronavirus 2 NEGATIVE NEGATIVE Final    Comment: (NOTE) SARS-CoV-2 target nucleic acids are NOT DETECTED. The SARS-CoV-2 RNA is generally detectable in upper and lower respiratory specimens during the acute phase of infection. Negative results do not preclude SARS-CoV-2 infection, do not rule out co-infections with other pathogens, and should not be used as the sole basis for treatment or other patient management decisions. Negative results must be combined with clinical observations, patient history, and epidemiological information. The expected result is Negative. Fact Sheet for Patients: SugarRoll.be Fact Sheet for Healthcare Providers: https://www.woods-mathews.com/ This test is not yet approved or cleared by the [place] FDA and  has been authorized for detection and/or diagnosis of SARS-CoV-2 by FDA under an Emergency Use Authorization (EUA). This EUA will remain  in effect (meaning this test can be used) for the duration of the COVID-19 declaration under Section 56 4(b)(1) of the Act, 21 U.S.C. section 360bbb-3(b)(1), unless the authorization is terminated or revoked sooner. Performed at [facility], [place] [address] [place], [place] [address]           Radiology Studies: No results found.    Scheduled Meds: . amoxicillin-clavulanate  1 tablet Oral BID  . enoxaparin (LOVENOX) injection  40 mg Subcutaneous Q24H  . pantoprazole  40 mg Oral Daily  . sodium chloride flush  3 mL Intravenous Once   Continuous Infusions: . methylPREDNISolone (SOLU-MEDROL) injection 1,000 mg (03/06/19 0516)     LOS: 2 days      Debbe Odea, MD [facility] Pager: [email or web address] Password [email or web address] 03/06/2019, 9:30 AM

## 2019-03-07 ENCOUNTER — Encounter (HOSPITAL_COMMUNITY): Payer: Self-pay | Admitting: *Deleted

## 2019-03-07 LAB — GLUCOSE, CAPILLARY: Glucose-Capillary: 108 mg/dL — ABNORMAL HIGH (ref 70–99)

## 2019-03-07 NOTE — Progress Notes (Signed)
PROGRESS NOTE    Gregg Chandler   ZOX:[id number]  DOB: Jun 12, 1996  DOA: 03/03/2019 PCP: Gregg Hams, FNP   Brief Narrative:  Gregg Chandler  is a 23 y.o. male without significant past medical history; who presents with complaints of 9 days of right eye vision loss.  He reports having a center of right eye visual field being grayed out. Seen by his ophthalmologist because of his symptoms and was referred to the emergency department for further evaluation. Reports similar symptoms of blurred vision in the right eye for about 2 days 5-6 years ago that resolved on its own.  He has been on Augmentin for a ear infection and has 3 days left of medication to take.    Subjective: He states his vision is improving.     Assessment & Plan:   Principal Problem:   Optic neuritis/ Multiple sclerosis  - noted on MRI - per  Neuro, no need for LP as his history and MRI are very convincing for MS - currently on high dose steroids- appreciate management per Neuro  Active Problems:     Leukocytosis   Otitis media - cont Augmentin   Time spent in minutes:  35 DVT prophylaxis: Lovenox Code Status: Full code Family Communication:  Disposition Plan: home when stable Consultants:   Neuro Procedures:    Antimicrobials:  Anti-infectives (From admission, onward)   Start     Dose/Rate Route Frequency Ordered Stop   03/04/19 1000  amoxicillin-clavulanate (AUGMENTIN) 875-125 MG per tablet 1 tablet     1 tablet Oral 2 times daily 03/04/19 0833 03/06/19 2114       Objective: Vitals:   03/06/19 1715 03/06/19 1937 03/07/19 0500 03/07/19 0837  BP: 131/61 (!) 128/59 113/60 116/66  Pulse: 81 71 (!) 58 61  Resp: 16 18 18 19   Temp: 98.2 F (36.8 C) 97.9 F (36.6 C) 98.6 F (37 C) 98.2 F (36.8 C)  TempSrc: Oral Oral Oral Oral  SpO2: 97% 99% 100% 99%  Weight:      Height:        Intake/Output Summary (Last 24 hours) at 03/07/2019 0928 Last data filed at 03/06/2019 1300 Gross per  24 hour  Intake 240 ml  Output -  Net 240 ml   Filed Weights   03/03/19 1752  Weight: 80.7 kg    Examination: General exam: Appears comfortable  HEENT: PERRLA, oral mucosa moist, no sclera icterus or thrush Respiratory system: Clear to auscultation. Respiratory effort normal. Cardiovascular system: S1 & S2 heard,  No murmurs  Gastrointestinal system: Abdomen soft, non-tender, nondistended. Normal bowel sounds   Central nervous system: Alert and oriented. Right eye visual acuity improving - No other focal neurological deficits. Extremities: No cyanosis, clubbing or edema Skin: No rashes or ulcers Psychiatry:  Mood & affect appropriate.    Data Reviewed: I have personally reviewed following labs and imaging studies  CBC: Recent Labs  Lab 03/03/19 1821 03/03/19 1838 03/05/19 0456  WBC  --  12.0* 15.7*  NEUTROABS  --  10.0*  --   HGB 17.0 16.3 14.9  HCT 50.0 50.1 45.2  MCV  --  87.1 85.1  PLT  --  309 811   Basic Metabolic Panel: Recent Labs  Lab 03/03/19 1821 03/03/19 1838 03/05/19 0456  NA 137 136 137  K 4.0 4.1 3.8  CL 103 100 102  CO2  --  23 25  GLUCOSE 109* 110* 128*  BUN 21* 17 12  CREATININE 0.80 0.91 0.87  CALCIUM  --  9.3 8.8*   GFR: Estimated Creatinine Clearance: 143.7 mL/min (by C-G formula based on SCr of 0.87 mg/dL). Liver Function Tests: Recent Labs  Lab 03/03/19 1838  AST 22  ALT 29  ALKPHOS 43  BILITOT 0.6  PROT 7.4  ALBUMIN 4.5   No results for input(s): LIPASE, AMYLASE in the last 168 hours. No results for input(s): AMMONIA in the last 168 hours. Coagulation Profile: Recent Labs  Lab 03/03/19 1838  INR 1.1   Cardiac Enzymes: No results for input(s): CKTOTAL, CKMB, CKMBINDEX, TROPONINI in the last 168 hours. BNP (last 3 results) No results for input(s): PROBNP in the last 8760 hours. HbA1C: No results for input(s): HGBA1C in the last 72 hours. CBG: Recent Labs  Lab 03/04/19 0354 03/06/19 0451 03/07/19 0502  GLUCAP  130* 113* 108*   Lipid Profile: No results for input(s): CHOL, HDL, LDLCALC, TRIG, CHOLHDL, LDLDIRECT in the last 72 hours. Thyroid Function Tests: No results for input(s): TSH, T4TOTAL, FREET4, T3FREE, THYROIDAB in the last 72 hours. Anemia Panel: No results for input(s): VITAMINB12, FOLATE, FERRITIN, TIBC, IRON, RETICCTPCT in the last 72 hours. Urine analysis: No results found for: COLORURINE, APPEARANCEUR, LABSPEC, PHURINE, GLUCOSEU, HGBUR, BILIRUBINUR, KETONESUR, PROTEINUR, UROBILINOGEN, NITRITE, LEUKOCYTESUR Sepsis Labs: @LABRCNTIP (procalcitonin:4,lacticidven:4) ) Recent Results (from the past 240 hour(s))  SARS CORONAVIRUS 2 Nasal Swab Aptima Multi Swab     Status: None   Collection Time: 03/04/19  6:44 AM   Specimen: Aptima Multi Swab; Nasal Swab  Result Value Ref Range Status   SARS Coronavirus 2 NEGATIVE NEGATIVE Final    Comment: (NOTE) SARS-CoV-2 target nucleic acids are NOT DETECTED. The SARS-CoV-2 RNA is generally detectable in upper and lower respiratory specimens during the acute phase of infection. Negative results do not preclude SARS-CoV-2 infection, do not rule out co-infections with other pathogens, and should not be used as the sole basis for treatment or other patient management decisions. Negative results must be combined with clinical observations, patient history, and epidemiological information. The expected result is Negative. Fact Sheet for Patients: HairSlick.no Fact Sheet for Healthcare Providers: quierodirigir.com This test is not yet approved or cleared by the [place] FDA and  has been authorized for detection and/or diagnosis of SARS-CoV-2 by FDA under an Emergency Use Authorization (EUA). This EUA will remain  in effect (meaning this test can be used) for the duration of the COVID-19 declaration under Section 56 4(b)(1) of the Act, 21 U.S.C. section 360bbb-3(b)(1), unless the  authorization is terminated or revoked sooner. Performed at [facility], [address], [place], [place] [address]          Radiology Studies: No results found.    Scheduled Meds: . enoxaparin (LOVENOX) injection  40 mg Subcutaneous Q24H  . pantoprazole  40 mg Oral Daily  . sodium chloride flush  3 mL Intravenous Once   Continuous Infusions: . methylPREDNISolone (SOLU-MEDROL) injection 1,000 mg (03/07/19 0538)     LOS: 3 days      Calvert Cantor, MD [facility] Pager: [email or web address] Password [facility] 03/07/2019, 9:28 AM

## 2019-03-07 NOTE — Progress Notes (Signed)
Received call from [facility] at [facility] asking about plan of care. Patient is currently out of network with insurance with no out of network benefit. Provided neurology contact information to assist with outpatient in network follow up.   Manya Silvas, RN CCM Transitions of Care [phone]

## 2019-03-07 NOTE — Progress Notes (Addendum)
Patient concerned about possible Toxoplasmosis or Toxocariasis as the source of his symptoms. Reviewed MRI brain images. Although the findings are most consistent with MS, an infectious etiology is also possible. Toxoplasma gondii Ab and [employer] for Toxocara canis have been ordered.   Electronically signed: Dr. Kerney Elbe

## 2019-03-08 LAB — TOXOPLASMA GONDII ANTIBODY, IGM: Toxoplasma Antibody- IgM: 3.5 AU/mL (ref 0.0–7.9)

## 2019-03-08 LAB — INFECT DISEASE AB IGM REFLEX 1

## 2019-03-08 MED ORDER — NAPHAZOLINE-GLYCERIN 0.012-0.2 % OP SOLN: 1.0000 [drp] | Freq: Four times a day (QID) | OPHTHALMIC | 0 refills | Status: AC | PRN

## 2019-03-08 NOTE — Progress Notes (Signed)
Patient discharge home. Discharge instructions explained to patient and he verbalized understanding. Took all personal belongings. No further questions or concerns voiced.

## 2019-03-08 NOTE — Discharge Summary (Signed)
Physician Discharge Summary  Gregg SeveranceBriam Pavelko ZOX:[id number] DOB: 04/10/1996 DOA: 03/03/2019  PCP: Maudie FlakesAnderson, Shane D, FNP  Admit date: 03/03/2019 Discharge date: 03/08/2019  Admitted From: home Disposition:  home   Recommendations for Outpatient Follow-up:  1. Please follow up on the following serologies ordered by neuro: Toxoplasma gondii Ab and Lab [employer] sendout for Toxocara canis    Discharge Condition:  stable   CODE STATUS:  Full code   Consultations:  Neurology    Discharge Diagnoses:  Principal Problem:   Multiple sclerosis (HCC) Active Problems:   Otitis media     Brief Narrative:  Gregg SeveranceBriam Ewbank is a 23 y.o.malewithoutsignificant past medical history; who presents with complaints of9 days of right eye vision loss. He reports having a center of right eye visual field being grayedout. Seen by his ophthalmologist because of his symptoms and was referred to the emergency department for further evaluation. Reports similar symptoms of blurred vision in the right eye for about 2 days 5-6 years ago that resolved on its own.He has been on Augmentin for a ear infection and has 3 days left of medication to take.    Subjective: He states his vision is improving.     Assessment & Plan:   Principal Problem:  Multiple sclerosis  - noted on MRI with right sided vision loss - per  Neuro, no need for LP as his history and MRI are very convincing for MS - he has completed a 5 day course of high dose steroids- appreciate management per Neuro- his vision is returning slowly. - neuro has ordered further testing for toxoplasmosis please follow as outpt  Active Problems:       Otitis media - he has completed the course of  Augmentin which was started as outpatient     Discharge Exam: Vitals:   03/07/19 2046 03/08/19 0552  BP: 135/69 120/72  Pulse: 74 64  Resp: 16 16  Temp: 98.3 F (36.8 C) 97.8 F (36.6 C)  SpO2: 98% 98%   Vitals:   03/07/19 0500  03/07/19 0837 03/07/19 2046 03/08/19 0552  BP: 113/60 116/66 135/69 120/72  Pulse: (!) 58 61 74 64  Resp: 18 19 16 16   Temp: 98.6 F (37 C) 98.2 F (36.8 C) 98.3 F (36.8 C) 97.8 F (36.6 C)  TempSrc: Oral Oral Oral Oral  SpO2: 100% 99% 98% 98%  Weight:      Height:        General: Pt is alert, awake, not in acute distress Cardiovascular: RRR, S1/S2 +, no rubs, no gallops Respiratory: CTA bilaterally, no wheezing, no rhonchi Abdominal: Soft, NT, ND, bowel sounds + Extremities: no edema, no cyanosis   Discharge Instructions  Discharge Instructions    Diet - low sodium heart healthy   Complete by: As directed    Increase activity slowly   Complete by: As directed      Allergies as of 03/08/2019   No Known Allergies     Medication List    STOP taking these medications   amoxicillin-clavulanate 875-125 MG tablet Commonly known as: AUGMENTIN     TAKE these medications   naphazoline-glycerin 0.012-0.2 % Soln Commonly known as: CLEAR EYES REDNESS Place 1-2 drops into both eyes 4 (four) times daily as needed for eye irritation.      Follow-up Information    Sater, Pearletha Furlichard A, MD. Schedule an appointment as soon as possible for a visit in 1 week(s).   Specialty: Neurology Contact information: [address] [place] [address]  [phone]          No Known Allergies   Procedures/Studies:    Ct Head Wo Contrast  Result Date: 03/03/2019 CLINICAL DATA:  Vision abnormality in right eye. EXAM: CT HEAD WITHOUT CONTRAST TECHNIQUE: Contiguous axial images were obtained from the base of the skull through the vertex without intravenous contrast. COMPARISON:  None. FINDINGS: Brain: No evidence of acute infarction, hemorrhage, hydrocephalus, extra-axial collection or mass lesion/mass effect. There is some apparent decreased attenuation in the right occipital lobe which is favored to represent artifact. Vascular: No hyperdense vessel or unexpected calcification.  Skull: Normal. Negative for fracture or focal lesion. Sinuses/Orbits: No acute finding. Other: None. IMPRESSION: No acute intracranial abnormality. Electronically Signed   By: Constance Holster M.D.   On: 03/03/2019 18:58   Mr Jeri Cos And Wo Contrast  Result Date: 03/04/2019 CLINICAL DATA:  Right eye vision loss beginning 9 days ago. Patient describes central vision loss. Referred to the emergency department by Ophthalmology. EXAM: MRI HEAD AND ORBITS WITHOUT AND WITH CONTRAST TECHNIQUE: Multiplanar, multiecho pulse sequences of the brain and surrounding structures were obtained without and with intravenous contrast. Multiplanar, multiecho pulse sequences of the orbits and surrounding structures were obtained including fat saturation techniques, before and after intravenous contrast administration. CONTRAST:  7.5 mL Gadavist COMPARISON:  CT head without contrast 03/03/2019 FINDINGS: MRI HEAD FINDINGS Brain: Multiple radially oriented T2 hyperintensities are noted adjacent to the corpus callosum. Most prominent lesions are in the left parietal lobe. There is abnormal T2 signal along the right side of the anterior genu of the corpus callosum. No restricted diffusion is associated with any of these lesions. There is enhancement of the lesion in the anterior genu of the right corpus callosum. A smaller lesion in the left side of the anterior genu does not enhance. There is also enhancement of the lesion adjacent to the right temporal tip diffusion-weighted images do demonstrate areas of T2 shine through. No acute hemorrhage or other mass lesion is present. The ventricles are of normal size. No significant extraaxial fluid collection is present. The internal auditory canals are within normal limits. The brainstem and cerebellum are within normal limits. Vascular: Flow is present in the major intracranial arteries. Skull and upper cervical spine: The craniocervical junction is normal. Upper cervical spine is within  normal limits. Marrow signal is unremarkable. MRI ORBITS FINDINGS Orbits: Globes are normal bilaterally. The lens is located. Lacrimal gland is unremarkable. The extraocular muscles are normal. Optic nerve is within normal limits bilaterally. There is no significant T2 hyperintensity or enhancement within the nerve. The optic chiasm is normal. The lesion lateral to the right lateral ventricle may impact the optic tracts. Visualized sinuses: The paranasal sinuses and mastoid air cells are clear. Soft tissues: Periorbital soft tissues are within normal limits. IMPRESSION: 1. Numerous T2 hyperintense lesions adjacent to the corpus callosum with some involving the corpus callosum is most suggestive of a demyelinating process such as multiple sclerosis. 2. Enhancing T2 hyperintense lesion adjacent to the temporal horn of the right lateral ventricle measures up to 6 mm. This is concerning for acute demyelination and may impact the optic tracts, consistent with the patient's symptoms. 3. Additional focal area of T2 hyperintensity with enhancement within the anterior genu of the corpus callosum on the right measures up to 7 mm. 4. Multiple other lesions do not enhance. There is no restricted diffusion. 5. No focal lesion within the optic nerves or chiasm. 6. Globes and orbits are otherwise normal. Electronically  Signed   By: Marin Robertshristopher  Mattern M.D.   On: 03/04/2019 05:34   Mr Rockwell GermanyOrbits W EAWo Contrast  Result Date: 03/04/2019 CLINICAL DATA:  Right eye vision loss beginning 9 days ago. Patient describes central vision loss. Referred to the emergency department by Ophthalmology. EXAM: MRI HEAD AND ORBITS WITHOUT AND WITH CONTRAST TECHNIQUE: Multiplanar, multiecho pulse sequences of the brain and surrounding structures were obtained without and with intravenous contrast. Multiplanar, multiecho pulse sequences of the orbits and surrounding structures were obtained including fat saturation techniques, before and after  intravenous contrast administration. CONTRAST:  7.5 mL Gadavist COMPARISON:  CT head without contrast 03/03/2019 FINDINGS: MRI HEAD FINDINGS Brain: Multiple radially oriented T2 hyperintensities are noted adjacent to the corpus callosum. Most prominent lesions are in the left parietal lobe. There is abnormal T2 signal along the right side of the anterior genu of the corpus callosum. No restricted diffusion is associated with any of these lesions. There is enhancement of the lesion in the anterior genu of the right corpus callosum. A smaller lesion in the left side of the anterior genu does not enhance. There is also enhancement of the lesion adjacent to the right temporal tip diffusion-weighted images do demonstrate areas of T2 shine through. No acute hemorrhage or other mass lesion is present. The ventricles are of normal size. No significant extraaxial fluid collection is present. The internal auditory canals are within normal limits. The brainstem and cerebellum are within normal limits. Vascular: Flow is present in the major intracranial arteries. Skull and upper cervical spine: The craniocervical junction is normal. Upper cervical spine is within normal limits. Marrow signal is unremarkable. MRI ORBITS FINDINGS Orbits: Globes are normal bilaterally. The lens is located. Lacrimal gland is unremarkable. The extraocular muscles are normal. Optic nerve is within normal limits bilaterally. There is no significant T2 hyperintensity or enhancement within the nerve. The optic chiasm is normal. The lesion lateral to the right lateral ventricle may impact the optic tracts. Visualized sinuses: The paranasal sinuses and mastoid air cells are clear. Soft tissues: Periorbital soft tissues are within normal limits. IMPRESSION: 1. Numerous T2 hyperintense lesions adjacent to the corpus callosum with some involving the corpus callosum is most suggestive of a demyelinating process such as multiple sclerosis. 2. Enhancing T2  hyperintense lesion adjacent to the temporal horn of the right lateral ventricle measures up to 6 mm. This is concerning for acute demyelination and may impact the optic tracts, consistent with the patient's symptoms. 3. Additional focal area of T2 hyperintensity with enhancement within the anterior genu of the corpus callosum on the right measures up to 7 mm. 4. Multiple other lesions do not enhance. There is no restricted diffusion. 5. No focal lesion within the optic nerves or chiasm. 6. Globes and orbits are otherwise normal. Electronically Signed   By: Marin Robertshristopher  Mattern M.D.   On: 03/04/2019 05:34     The results of significant diagnostics from this hospitalization (including imaging, microbiology, ancillary and laboratory) are listed below for reference.     Microbiology: Recent Results (from the past 240 hour(s))  SARS CORONAVIRUS 2 Nasal Swab Aptima Multi Swab     Status: None   Collection Time: 03/04/19  6:44 AM   Specimen: Aptima Multi Swab; Nasal Swab  Result Value Ref Range Status   SARS Coronavirus 2 NEGATIVE NEGATIVE Final    Comment: (NOTE) SARS-CoV-2 target nucleic acids are NOT DETECTED. The SARS-CoV-2 RNA is generally detectable in upper and lower respiratory specimens during the acute phase  of infection. Negative results do not preclude SARS-CoV-2 infection, do not rule out co-infections with other pathogens, and should not be used as the sole basis for treatment or other patient management decisions. Negative results must be combined with clinical observations, patient history, and epidemiological information. The expected result is Negative. Fact Sheet for Patients: HairSlick.nohttps://www.fda.gov/media/138098/download Fact Sheet for Healthcare Providers: quierodirigir.comhttps://www.fda.gov/media/138095/download This test is not yet approved or cleared by the [place] FDA and  has been authorized for detection and/or diagnosis of SARS-CoV-2 by FDA under an Emergency Use Authorization  (EUA). This EUA will remain  in effect (meaning this test can be used) for the duration of the COVID-19 declaration under Section 56 4(b)(1) of the Act, 21 U.S.C. section 360bbb-3(b)(1), unless the authorization is terminated or revoked sooner. Performed at [facility], [address] [place], [place] [address]      Labs: BNP (last 3 results) No results for input(s): BNP in the last 8760 hours. Basic Metabolic Panel: Recent Labs  Lab 03/03/19 1821 03/03/19 1838 03/05/19 0456  NA 137 136 137  K 4.0 4.1 3.8  CL 103 100 102  CO2  --  23 25  GLUCOSE 109* 110* 128*  BUN 21* 17 12  CREATININE 0.80 0.91 0.87  CALCIUM  --  9.3 8.8*   Liver Function Tests: Recent Labs  Lab 03/03/19 1838  AST 22  ALT 29  ALKPHOS 43  BILITOT 0.6  PROT 7.4  ALBUMIN 4.5   No results for input(s): LIPASE, AMYLASE in the last 168 hours. No results for input(s): AMMONIA in the last 168 hours. CBC: Recent Labs  Lab 03/03/19 1821 03/03/19 1838 03/05/19 0456  WBC  --  12.0* 15.7*  NEUTROABS  --  10.0*  --   HGB 17.0 16.3 14.9  HCT 50.0 50.1 45.2  MCV  --  87.1 85.1  PLT  --  309 288   Cardiac Enzymes: No results for input(s): CKTOTAL, CKMB, CKMBINDEX, TROPONINI in the last 168 hours. BNP: Invalid input(s): POCBNP CBG: Recent Labs  Lab 03/04/19 0354 03/06/19 0451 03/07/19 0502  GLUCAP 130* 113* 108*   D-Dimer No results for input(s): DDIMER in the last 72 hours. Hgb A1c No results for input(s): HGBA1C in the last 72 hours. Lipid Profile No results for input(s): CHOL, HDL, LDLCALC, TRIG, CHOLHDL, LDLDIRECT in the last 72 hours. Thyroid function studies No results for input(s): TSH, T4TOTAL, T3FREE, THYROIDAB in the last 72 hours.  Invalid input(s): FREET3 Anemia work up No results for input(s): VITAMINB12, FOLATE, FERRITIN, TIBC, IRON, RETICCTPCT in the last 72 hours. Urinalysis No results found for: COLORURINE, APPEARANCEUR, LABSPEC, PHURINE, GLUCOSEU, HGBUR,  BILIRUBINUR, KETONESUR, PROTEINUR, UROBILINOGEN, NITRITE, LEUKOCYTESUR Sepsis Labs Invalid input(s): PROCALCITONIN,  WBC,  LACTICIDVEN Microbiology Recent Results (from the past 240 hour(s))  SARS CORONAVIRUS 2 Nasal Swab Aptima Multi Swab     Status: None   Collection Time: 03/04/19  6:44 AM   Specimen: Aptima Multi Swab; Nasal Swab  Result Value Ref Range Status   SARS Coronavirus 2 NEGATIVE NEGATIVE Final    Comment: (NOTE) SARS-CoV-2 target nucleic acids are NOT DETECTED. The SARS-CoV-2 RNA is generally detectable in upper and lower respiratory specimens during the acute phase of infection. Negative results do not preclude SARS-CoV-2 infection, do not rule out co-infections with other pathogens, and should not be used as the sole basis for treatment or other patient management decisions. Negative results must be combined with clinical observations, patient history, and epidemiological information. The expected  result is Negative. Fact Sheet for Patients: HairSlick.no Fact Sheet for Healthcare Providers: quierodirigir.com This test is not yet approved or cleared by the [place] FDA and  has been authorized for detection and/or diagnosis of SARS-CoV-2 by FDA under an Emergency Use Authorization (EUA). This EUA will remain  in effect (meaning this test can be used) for the duration of the COVID-19 declaration under Section 56 4(b)(1) of the Act, 21 U.S.C. section 360bbb-3(b)(1), unless the authorization is terminated or revoked sooner. Performed at [facility], [address] [place], [place] [address]      Time coordinating discharge in minutes: 55  SIGNED:   Calvert Cantor, MD  [facility] 03/08/2019, 8:54 AM Pager   If 7PM-7AM, please contact night-coverage [email or web address] Password [email or web address]

## 2021-08-21 IMAGING — MR MRI HEAD WITHOUT AND WITH CONTRAST
22 of 25 series · 38 of 48 positions shown · IV contrast (gadavist)
Comparison: CT head without contrast 03/03/2019

CLINICAL DATA: Right eye vision loss beginning 9 days ago. Patient
describes central vision loss. Referred to the emergency department
by Ophthalmology.

EXAM:
MRI HEAD AND ORBITS WITHOUT AND WITH CONTRAST
TECHNIQUE: Multiplanar, multiecho pulse sequences of the brain and surrounding
structures were obtained without and with intravenous contrast.
Multiplanar, multiecho pulse sequences of the orbits and surrounding
structures were obtained including fat saturation techniques, before
and after intravenous contrast administration.
CONTRAST:  7.5 mL Gadavist

[Series 5: DWI · axial · 3.0mm · 0.88mm/px · z∈[-65,+76]mm · 5 of 96 slices shown (1 of 4)]
[im 1/96]
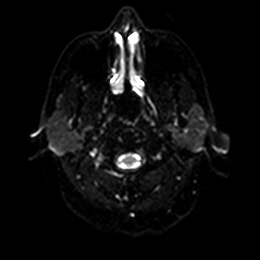
[im 24/96]
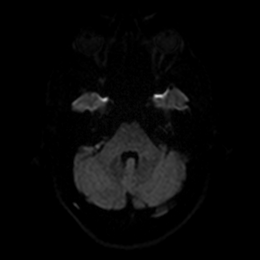
[im 48/96]
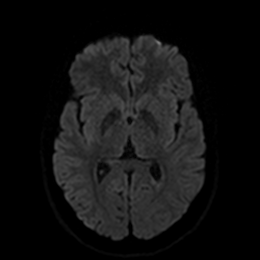
[im 72/96]
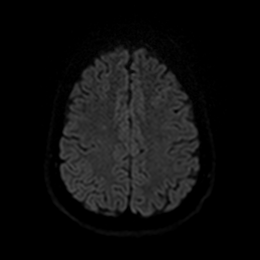
[im 96/96]
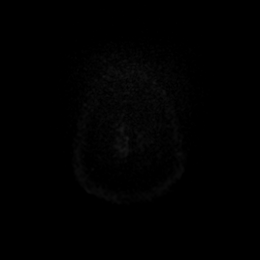

[Series 6: DWI · axial · 3.0mm · 0.88mm/px · z∈[-65,+76]mm · 2 of 48 slices shown (2 of 4)]
[im 1/48]
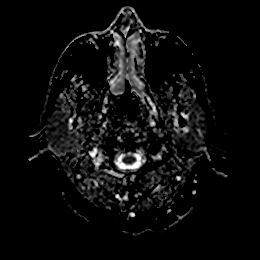
[im 48/48]
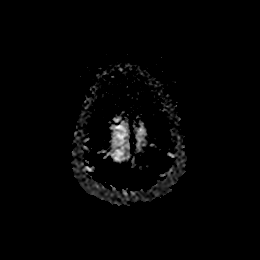

[Series 7: DWI · coronal · 4.0mm · 0.88mm/px · 4 of 74 slices shown (3 of 4)]
[im 1/74]
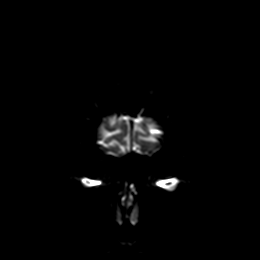
[im 25/74]
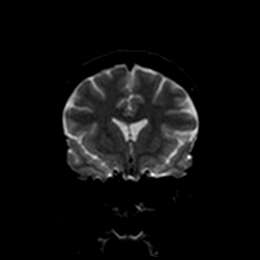
[im 49/74]
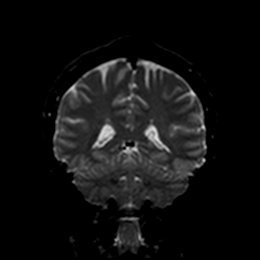
[im 74/74]
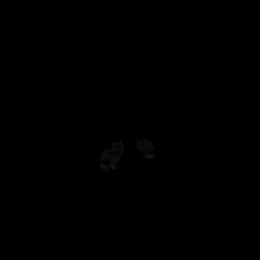

[Series 8: DWI · coronal · 4.0mm · 0.88mm/px · 2 of 37 slices shown (4 of 4)]
[im 1/37]
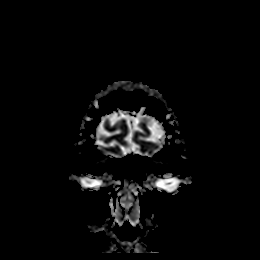
[im 37/37]
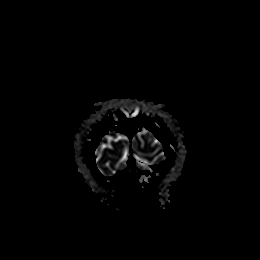

[Series 9: T1 · sagittal · 5.0mm · 0.75mm/px · 1 of 23 slices shown (1 of 3)]
[im 1/23]
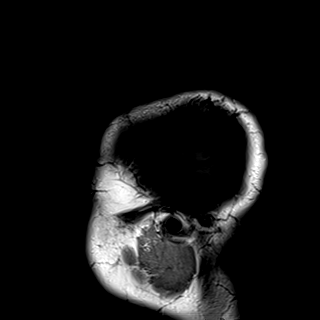

[Series 10: T2 · axial · 5.0mm · 0.72mm/px · 1 of 25 slices shown]
[im 1/25]
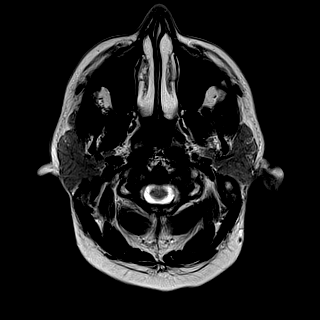

[Series 11: FLAIR · axial · 5.0mm · 0.45mm/px · 1 of 25 slices shown]
[im 1/25]
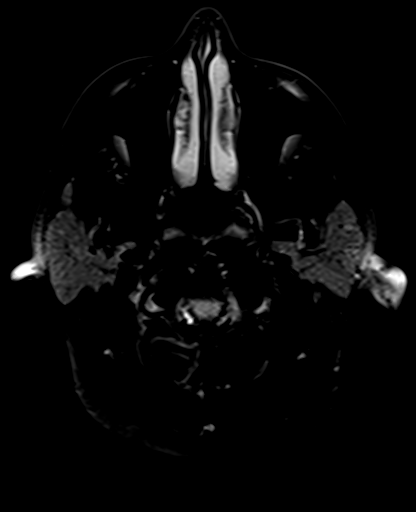

[Series 12: mag_images · axial · 3.0mm · 0.90mm/px · z∈[-74,+103]mm · 3 of 60 slices shown]
[im 1/60]
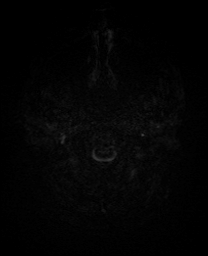
[im 30/60]
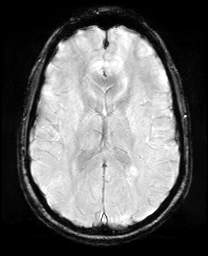
[im 60/60]
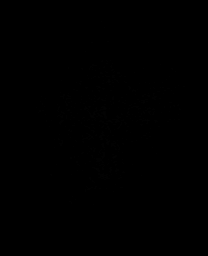

[Series 13: pha_images · axial · 3.0mm · 0.90mm/px · z∈[-71,+100]mm · 3 of 58 slices shown]
[im 1/58]
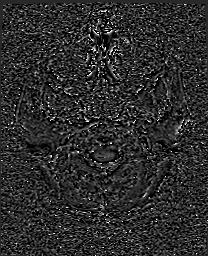
[im 29/58]
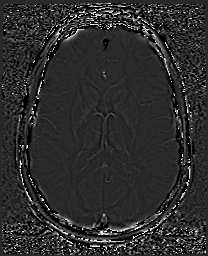
[im 58/58]
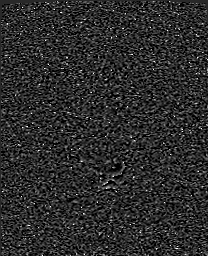

[Series 14: swi_images · axial · 3.0mm · 0.90mm/px · z∈[-74,+13]mm · 2 of 60 slices shown]
[im 1/60]
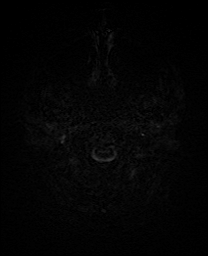
[im 30/60]
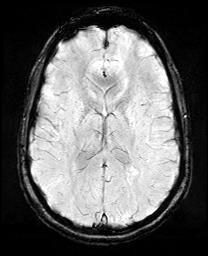

[Series 18: T2 fat-sat · axial · 3.0mm · 0.54mm/px · 1 of 15 slices shown (1 of 6)]
[im 1/15]
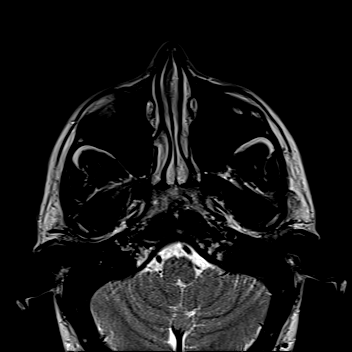

[Series 19: T2 fat-sat · axial · 3.0mm · 0.54mm/px · 1 of 15 slices shown (2 of 6)]
[im 1/15]
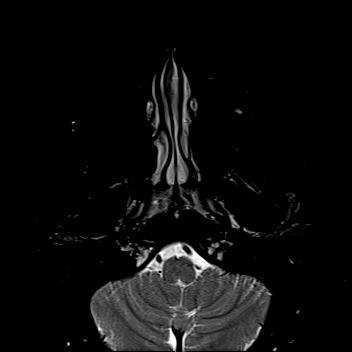

[Series 20: T2 fat-sat · axial · 3.0mm · 0.54mm/px · 1 of 15 slices shown (3 of 6)]
[im 1/15]
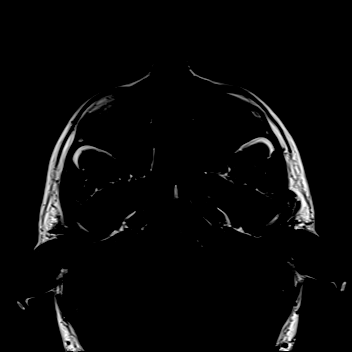

[Series 21: T2 fat-sat · coronal · 3.0mm · 0.54mm/px · 1 of 29 slices shown (4 of 6)]
[im 1/29]
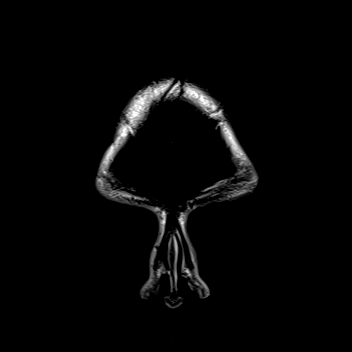

[Series 22: T2 fat-sat · coronal · 3.0mm · 0.54mm/px · 1 of 29 slices shown (5 of 6)]
[im 1/29]
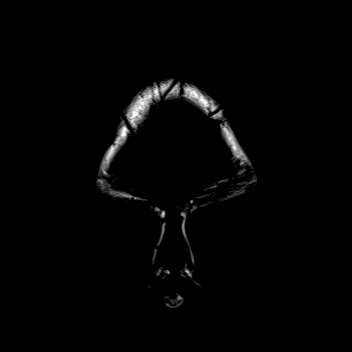

[Series 23: T2 fat-sat · coronal · 3.0mm · 0.54mm/px · 1 of 29 slices shown (6 of 6)]
[im 1/29]
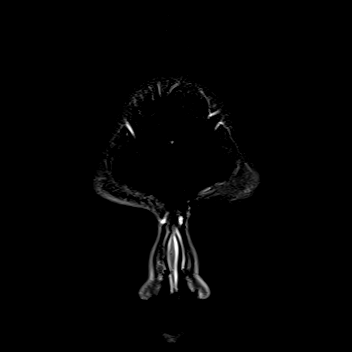

[Series 24: T1 · coronal · 3.0mm · 0.37mm/px · 1 of 29 slices shown (2 of 3)]
[im 1/29]
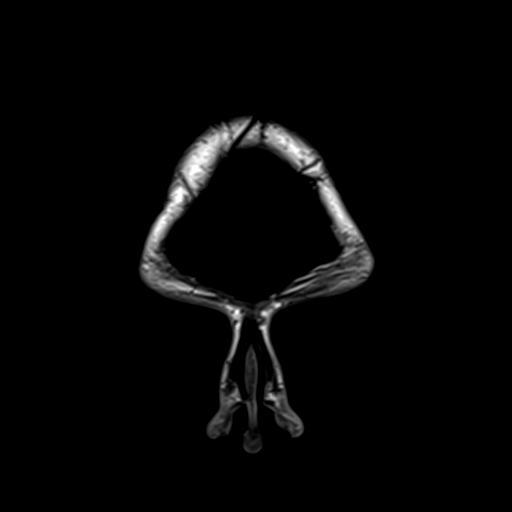

[Series 25: T1 · axial · non-contrast · 3.0mm · 0.37mm/px · 1 of 15 slices shown (3 of 3)]
[im 1/15]
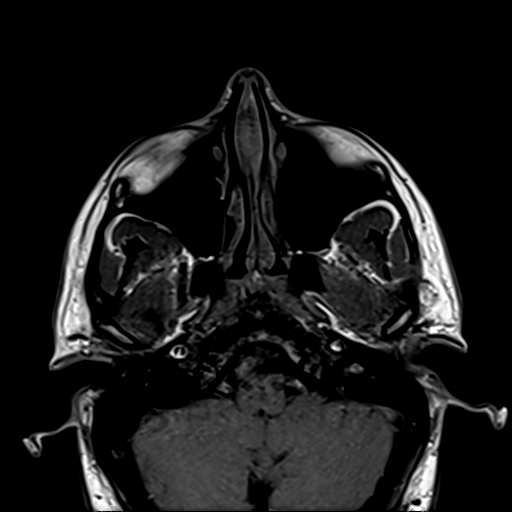

[Series 30: T2 post-contrast · coronal · 5.0mm · 0.72mm/px · 2 of 32 slices shown]
[im 1/32]
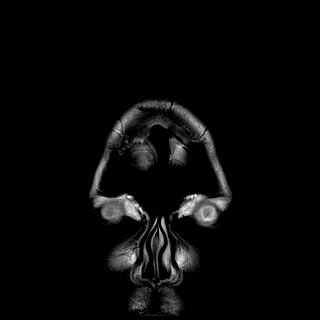
[im 32/32]
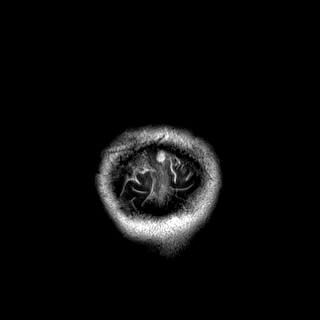

[Series 32: T1 fat-sat post-contrast · axial · 3.0mm · 0.37mm/px · 1 of 15 slices shown (1 of 2)]
[im 1/15]
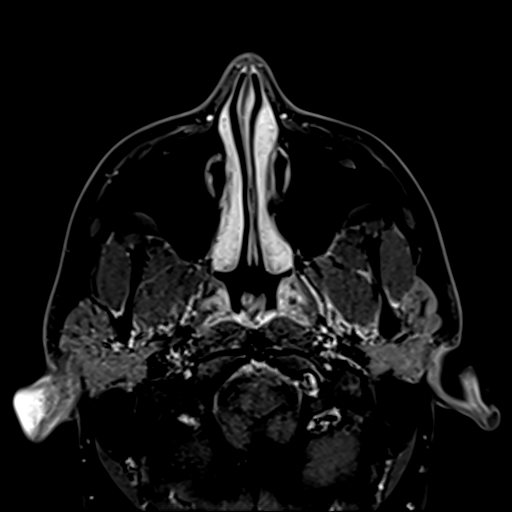

[Series 33: T1 fat-sat post-contrast · coronal · 3.0mm · 0.37mm/px · 1 of 29 slices shown (2 of 2)]
[im 1/29]
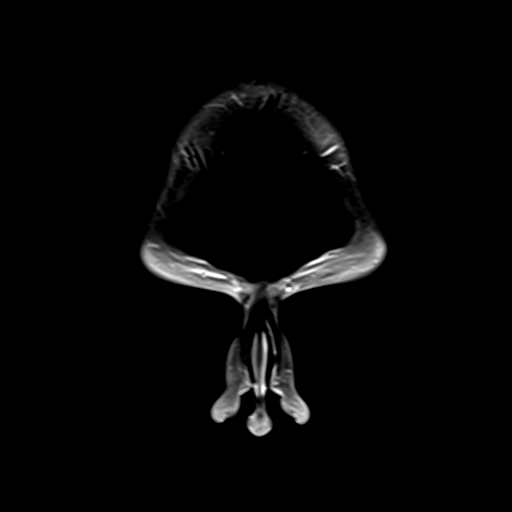

[Series 34: T1 post-contrast · coronal · 5.0mm · 0.34mm/px · 2 of 32 slices shown]
[im 1/32]
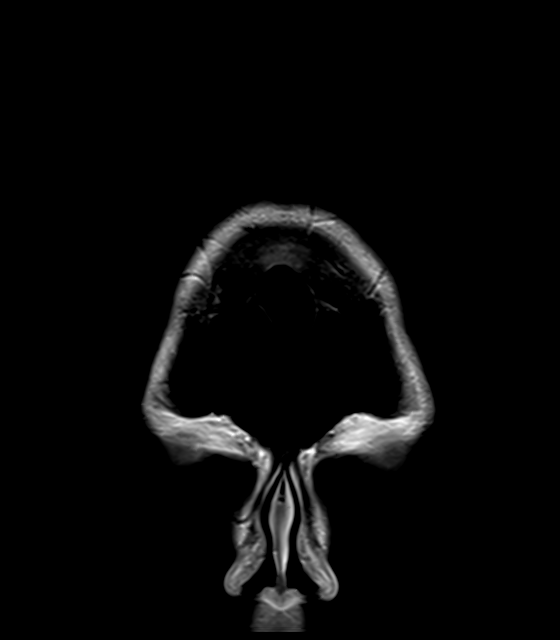
[im 32/32]
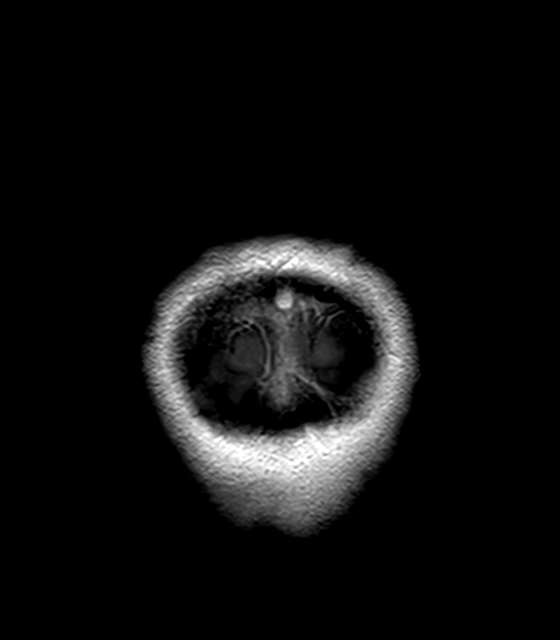

[38 of 48 positions shown; findings below may reference images not displayed]

FINDINGS: MRI HEAD FINDINGS

Brain: Multiple radially oriented T2 hyperintensities are noted
adjacent to the corpus callosum. Most prominent lesions are in the
left parietal lobe. There is abnormal T2 signal along the right side
of the anterior genu of the corpus callosum. No restricted diffusion
is associated with any of these lesions. There is enhancement of the
lesion in the anterior genu of the right corpus callosum. A smaller
lesion in the left side of the anterior genu does not enhance. There
is also enhancement of the lesion adjacent to the right temporal tip
diffusion-weighted images do demonstrate areas of T2 shine through.

No acute hemorrhage or other mass lesion is present. The ventricles
are of normal size. No significant extraaxial fluid collection is
present.

The internal auditory canals are within normal limits. The brainstem
and cerebellum are within normal limits.

Vascular: Flow is present in the major intracranial arteries.

Skull and upper cervical spine: The craniocervical junction is
normal. Upper cervical spine is within normal limits. Marrow signal
is unremarkable.

MRI ORBITS FINDINGS

Orbits: Globes are normal bilaterally. The lens is located. Lacrimal
gland is unremarkable. The extraocular muscles are normal. Optic
nerve is within normal limits bilaterally. There is no significant
T2 hyperintensity or enhancement within the nerve. The optic chiasm
is normal. The lesion lateral to the right lateral ventricle may
impact the optic tracts.

Visualized sinuses: The paranasal sinuses and mastoid air cells are
clear.

Soft tissues: Periorbital soft tissues are within normal limits.
IMPRESSION: 1. Numerous T2 hyperintense lesions adjacent to the corpus callosum
with some involving the corpus callosum is most suggestive of a
demyelinating process such as multiple sclerosis.
2. Enhancing T2 hyperintense lesion adjacent to the temporal horn of
the right lateral ventricle measures up to 6 mm. This is concerning
for acute demyelination and may impact the optic tracts, consistent
with the patient's symptoms.
3. Additional focal area of T2 hyperintensity with enhancement
within the anterior genu of the corpus callosum on the right
measures up to 7 mm.
4. Multiple other lesions do not enhance. There is no restricted
diffusion.
5. No focal lesion within the optic nerves or chiasm.
6. Globes and orbits are otherwise normal.
